# Patient Record
Sex: Male | Born: 1985 | Race: White | Hispanic: No | Marital: Single | Smoking: Former smoker
Health system: Southern US, Community
[De-identification: ages and names within clinical notes are randomized; demographics above are authoritative.]

## PROBLEM LIST (undated history)

## (undated) DIAGNOSIS — I10 Essential (primary) hypertension: Secondary | ICD-10-CM

## (undated) DIAGNOSIS — T7840XA Allergy, unspecified, initial encounter: Secondary | ICD-10-CM

## (undated) HISTORY — PX: OTHER SURGICAL HISTORY: SHX169

## (undated) HISTORY — DX: Allergy, unspecified, initial encounter: T78.40XA

---
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
## 2006-03-11 ENCOUNTER — Ambulatory Visit: Payer: Self-pay | Admitting: Sports Medicine

## 2006-07-08 ENCOUNTER — Ambulatory Visit: Payer: Self-pay | Admitting: Family Medicine

## 2006-08-12 ENCOUNTER — Ambulatory Visit: Payer: Self-pay | Admitting: Family Medicine

## 2006-08-26 ENCOUNTER — Ambulatory Visit: Payer: Self-pay | Admitting: Family Medicine

## 2006-09-10 ENCOUNTER — Ambulatory Visit: Payer: Self-pay | Admitting: Family Medicine

## 2006-10-07 ENCOUNTER — Ambulatory Visit: Payer: Self-pay | Admitting: Family Medicine

## 2006-11-27 DIAGNOSIS — F1010 Alcohol abuse, uncomplicated: Secondary | ICD-10-CM

## 2006-11-27 DIAGNOSIS — F909 Attention-deficit hyperactivity disorder, unspecified type: Secondary | ICD-10-CM | POA: Insufficient documentation

## 2006-11-27 DIAGNOSIS — F17200 Nicotine dependence, unspecified, uncomplicated: Secondary | ICD-10-CM | POA: Insufficient documentation

## 2006-11-27 DIAGNOSIS — I10 Essential (primary) hypertension: Secondary | ICD-10-CM | POA: Insufficient documentation

## 2006-11-27 DIAGNOSIS — E669 Obesity, unspecified: Secondary | ICD-10-CM | POA: Insufficient documentation

## 2006-12-02 ENCOUNTER — Encounter: Payer: Self-pay | Admitting: Family Medicine

## 2006-12-02 ENCOUNTER — Ambulatory Visit: Payer: Self-pay | Admitting: Family Medicine

## 2006-12-16 ENCOUNTER — Telehealth: Payer: Self-pay | Admitting: *Deleted

## 2006-12-18 ENCOUNTER — Encounter: Admission: RE | Admit: 2006-12-18 | Discharge: 2006-12-18 | Payer: Self-pay | Admitting: Sports Medicine

## 2006-12-18 ENCOUNTER — Encounter: Payer: Self-pay | Admitting: Family Medicine

## 2006-12-18 ENCOUNTER — Ambulatory Visit: Payer: Self-pay | Admitting: Family Medicine

## 2006-12-18 DIAGNOSIS — M25579 Pain in unspecified ankle and joints of unspecified foot: Secondary | ICD-10-CM

## 2006-12-19 ENCOUNTER — Telehealth: Payer: Self-pay | Admitting: Family Medicine

## 2007-07-03 ENCOUNTER — Telehealth (INDEPENDENT_AMBULATORY_CARE_PROVIDER_SITE_OTHER): Payer: Self-pay | Admitting: *Deleted

## 2007-07-03 ENCOUNTER — Ambulatory Visit: Payer: Self-pay | Admitting: Family Medicine

## 2007-07-03 ENCOUNTER — Encounter: Admission: RE | Admit: 2007-07-03 | Discharge: 2007-07-03 | Payer: Self-pay | Admitting: Sports Medicine

## 2007-07-10 ENCOUNTER — Ambulatory Visit: Payer: Self-pay | Admitting: Family Medicine

## 2007-07-10 ENCOUNTER — Encounter (INDEPENDENT_AMBULATORY_CARE_PROVIDER_SITE_OTHER): Payer: Self-pay | Admitting: *Deleted

## 2007-07-17 ENCOUNTER — Ambulatory Visit: Payer: Self-pay | Admitting: Internal Medicine

## 2007-07-17 ENCOUNTER — Telehealth: Payer: Self-pay | Admitting: *Deleted

## 2008-01-15 ENCOUNTER — Telehealth: Payer: Self-pay | Admitting: Family Medicine

## 2008-02-09 ENCOUNTER — Emergency Department (HOSPITAL_COMMUNITY): Admission: EM | Admit: 2008-02-09 | Discharge: 2008-02-10 | Payer: Self-pay | Admitting: Emergency Medicine

## 2008-02-12 ENCOUNTER — Ambulatory Visit (HOSPITAL_COMMUNITY): Admission: RE | Admit: 2008-02-12 | Discharge: 2008-02-12 | Payer: Self-pay | Admitting: Otolaryngology

## 2009-02-25 ENCOUNTER — Emergency Department (HOSPITAL_COMMUNITY): Admission: EM | Admit: 2009-02-25 | Discharge: 2009-02-25 | Payer: Self-pay | Admitting: Emergency Medicine

## 2009-03-01 ENCOUNTER — Encounter: Admission: RE | Admit: 2009-03-01 | Discharge: 2009-03-01 | Payer: Self-pay | Admitting: Family Medicine

## 2009-03-01 ENCOUNTER — Ambulatory Visit: Payer: Self-pay | Admitting: Family Medicine

## 2009-03-01 ENCOUNTER — Encounter: Payer: Self-pay | Admitting: *Deleted

## 2009-03-01 DIAGNOSIS — M25519 Pain in unspecified shoulder: Secondary | ICD-10-CM | POA: Insufficient documentation

## 2009-03-02 ENCOUNTER — Telehealth: Payer: Self-pay | Admitting: *Deleted

## 2009-03-02 ENCOUNTER — Telehealth (INDEPENDENT_AMBULATORY_CARE_PROVIDER_SITE_OTHER): Payer: Self-pay | Admitting: *Deleted

## 2009-03-03 ENCOUNTER — Encounter: Payer: Self-pay | Admitting: *Deleted

## 2009-08-25 ENCOUNTER — Emergency Department (HOSPITAL_COMMUNITY): Admission: EM | Admit: 2009-08-25 | Discharge: 2009-08-25 | Payer: Self-pay | Admitting: Family Medicine

## 2009-09-11 ENCOUNTER — Ambulatory Visit: Payer: Self-pay | Admitting: Family Medicine

## 2009-09-11 DIAGNOSIS — R05 Cough: Secondary | ICD-10-CM

## 2010-01-17 ENCOUNTER — Telehealth: Payer: Self-pay | Admitting: Family Medicine

## 2010-01-18 ENCOUNTER — Ambulatory Visit: Payer: Self-pay | Admitting: Family Medicine

## 2010-01-26 DIAGNOSIS — S90569A Insect bite (nonvenomous), unspecified ankle, initial encounter: Secondary | ICD-10-CM | POA: Insufficient documentation

## 2010-01-26 DIAGNOSIS — W57XXXA Bitten or stung by nonvenomous insect and other nonvenomous arthropods, initial encounter: Secondary | ICD-10-CM | POA: Insufficient documentation
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
## 2010-07-17 ENCOUNTER — Ambulatory Visit: Payer: Self-pay | Admitting: Family Medicine

## 2010-07-17 DIAGNOSIS — H6120 Impacted cerumen, unspecified ear: Secondary | ICD-10-CM

## 2010-10-27 ENCOUNTER — Emergency Department (HOSPITAL_COMMUNITY)
Admission: EM | Admit: 2010-10-27 | Discharge: 2010-10-27 | Payer: Self-pay | Source: Home / Self Care | Admitting: Emergency Medicine

## 2010-10-30 NOTE — Assessment & Plan Note (Signed)
Summary: PHYSICAL/[facility]   Vital Signs:  Patient profile:   25 year old male Height:      69.75 inches Weight:      248.7 pounds BMI:     36.07 Temp:     98.3 degrees F oral Pulse rate:   89 / minute BP sitting:   131 / 86  (left arm) Cuff size:   large  Vitals Entered By: Garen Grams LPN (July 17, 2010 9:38 AM) CC: cpe Is Patient Diabetic? No Pain Assessment Patient in pain? no        Primary Care Tavon Corriher:  Bobby Rumpf  MD  CC:  cpe.  History of Present Illness: 1) Obesity: Wants to lose weight. 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 Weight was 242 lbs in April 2011, 239 lbsv last July 2010 - now 248 lbs. Eats 4-5 servings fruits and vegetables daily, eats lean meats. Drinks sodas, gatorade, 3-4 beers per week. No fast foods or fried foods. Motivated to join gym and start exercise routine. BMI = 36  ROS: Denies chest pain, dyspnea on exertion, hypothyroid symptoms, depression, issues with bladder / bowel, sexual dysfunction   2) "Male enhancement" questions: Questions about male enhancement products. No sexual issues - was just wondering if safe to use.   3) Former tobacco use: quit smoking at age 82 (started at age 28). Has not smoked since.   4) HTN: No medications currently. Diet and (lack of) exercise as above. Denies chest pain, dyspnea, LE edema, focal neurological signs. Does not monitor salt intake.  Habits & Providers  Alcohol-Tobacco-Diet     Tobacco Status: quit     Tobacco Counseling: not to resume use of tobacco products     Passive Smoke Exposure: yes  Current Medications (verified): 1)  None  Allergies (verified): 1)  ! Penicillin  Past History:  Past Medical History: Last updated: 11/27/2006 oppositional defiant d/o  Family History: Last updated: 12/02/2006 brother with Autism HTN maternal GM, mother  Social History: Last updated:  12/02/2006 Lives with roomate, moved out of mom's house in 6/07.; graduated from [employer]- looking into [employer]; Enjoys art.  Has trouble with math.  Now working at [employer]  Social History: Smoking Status:  quit  Physical Exam  General:  VS reviewed.  Well appearing, NAD, obese, pleasant Head:  normocephalic and atraumatic.   Eyes:  vision grossly intact, pupils equal, pupils round, pupils reactive to light, corneas and lenses clear, and no retinal abnormalitiies.   Ears:  R ear normal and L cerumen impaction (flushed by nurse) - then TM clear   Nose:  no external deformity.   Mouth:  good dentition, no gingival abnormalities, no dental plaque, and pharynx pink and moist.   Neck:  supple, full ROM, and no masses.   Chest Wall:  no deformities and no tenderness.   Breasts:  no gynecomastia and no masses.   Lungs:  normal respiratory effort, no intercostal retractions, no accessory muscle use, normal breath sounds, no crackles, and no wheezes.   Heart:  normal rate, regular rhythm, and no murmur.   Abdomen:  soft, non-tender, normal bowel sounds, no distention, no masses, no guarding, no rigidity, no rebound tenderness, and no abdominal hernia.   Msk:  normal ROM, no joint tenderness, no joint swelling, no joint warmth, no redness over joints, no joint deformities, and no joint instability.  Pulses:  R radial normal and L radial normal.   Extremities:  No clubbing, cyanosis, edema, or deformity noted with normal full range of motion of all joints.   Neurologic:  alert & oriented X3, cranial nerves II-XII intact, strength normal in all extremities, sensation intact to light touch, sensation intact to pinprick, gait normal, and DTRs symmetrical and normal.   Skin:  few tattoos o/w normal  Psych:  Oriented X3, memory intact for recent and remote, normally interactive, good eye contact, not anxious appearing, and not depressed appearing.  Pleasant.    Impression & Recommendations:  Problem # 1:   IMPACTED CERUMEN (ICD-380.4) Assessment New Left ear. Flushed by nurse - normal exam following flush.  Orders: Cerumen Impaction Removal-[facility] (16109)  Problem # 2:  TOBACCO DEPENDENCE (ICD-305.1) Assessment: Improved Quit smoking several years ago. Congratulated on efforts to remain quit.  Problem # 3:  OBESITY, NOS (ICD-278.00) Advised patient to eliminate sodas, beer, sugary drinks. Patient highly motivated today. Will continue to make healthy food choices; will hopefully commit himself to an exercise program.  Will follow in 6 months.  Orders: [facility]- Est Level  3 (60454)  Problem # 4:  Preventive Health Care (ICD-V70.0) Flu shot deferred today.   Problem # 5:  HYPERTENSION, BENIGN SYSTEMIC (ICD-401.1) At goal today off medications. Advised to stay quit on tobacco use, increase exercise, follow DASH diet, lose weight. Will follow in 6 months. Would check CMET, lipids at that time.   Other Orders: [facility] - Est  18-39 yrs [id number])  Patient Instructions: 1)  It was great to meet you today!  2)  Eliminate sodas and beer from your diet - replace with water. 3)  Get 30-45 minutes of exercise at least 5 times per week - try to rejoin the gym  4)  Follow up with me in 6 months    Orders Added: 1)  Cerumen Impaction Removal-[facility] [69210] 2)  [facility] - Est  18-39 yrs [91478]

## 2010-10-30 NOTE — Progress Notes (Signed)
Summary: triage - Tick bite    Phone Note Call from Patient Call back at Home Phone [phone]   Caller: Patient Summary of Call: Found a tic and read around area & not sure if something still might still be in area.  Should he be seen? Initial call taken by: Clydell Hakim,  January 17, 2010 4:23 PM  Follow-up for Phone Call        found tick today. skin is red from where he tore it off. thinks part of  the bug may be in him. wants it checked. appt at 8:15 tomorrow. told him other signs to watch out for for if tick transmitted a disease Follow-up by: Golden Circle RN,  January 17, 2010 4:49 PM  Additional Follow-up for Phone Call Additional follow up Details #1::        Reviewed Dr. Leonie Green clinic visit. Patient prophylaxed with Doxcycline 200 mg  Additional Follow-up by: Bobby Rumpf  MD,  January 23, 2010 9:32 AM

## 2010-10-30 NOTE — Assessment & Plan Note (Signed)
Summary: tick bite/[place]/carew   Vital Signs:  Patient profile:   25 year old male Height:      69.75 inches Weight:      242.2 pounds BMI:     35.13 Temp:     98.3 degrees F oral Pulse rate:   77 / minute BP sitting:   143 / 84  (left arm) Cuff size:   regular  Vitals Entered By: Garen Grams LPN (January 18, 2010 9:06 AM) CC: tick bite on inner thigh Is Patient Diabetic? No Pain Assessment Patient in pain? no        Primary Care Provider:  Bobby Rumpf  MD  CC:  tick bite on inner thigh.  History of Present Illness: 25 yo here for  work-in for tick bite  yesterday saw a tick in his groin area and pulled it out.  Was usnure if he got all of it.  Fels confident it was a tick. Did not bring in today.  Mild redness around the area but otherwise asymptomatic.  Denies fever, rash, joint pain, arthalgias, n/v, or other systemic symptoms.  No outdoor activities.  Habits & Providers  Alcohol-Tobacco-Diet     Tobacco Status: current     Tobacco Counseling: to quit use of tobacco products  Allergies: 1)  ! Penicillin PMH-FH-SH reviewed-no changes except otherwise noted  Social History: Smoking Status:  current  Review of Systems      See HPI  Physical Exam  General:  VS reviewed.  Well appearing, NAD Skin:  right groin:0.5 cm area of erythema around an insect bite.  Scab present, removed with sterile pickups after area cleaned to make sure no tick parts still present.     Impression & Recommendations:  Problem # 1:  TICK BITE (ICD-E906.4)  Will prophylax with Doxycyclin 200 mg which will prevent lyme disease and is also antibiotic of choice for RMSF.  Asymptomatic.  Given red flags for return.  if develops signs of RMSF, will need a complete course of docycycline.  Orders: [facility]- Est Level  3 (99213)  Complete Medication List: 1)  Refenesen Dm 400-20 Mg Tabs (Dextromethorphan-guaifenesin) .Marland Kitchen.. 1 tab by mouth every 4hours as needed for cough 2)  Doxycycline Hyclate 100  Mg Caps (Doxycycline hyclate) .... Take 2 tablets  Patient Instructions: 1)  Sent you prescription for doxycycline 2)  Return if you notice expanding redness at area, fevers, chills, joint pain, muscle pain, nausea/vomiting Prescriptions: DOXYCYCLINE HYCLATE 100 MG CAPS (DOXYCYCLINE HYCLATE) take 2 tablets  #2 x 0   Entered and Authorized by:   Delbert Harness MD   Signed by:   Delbert Harness MD on 01/18/2010   Method used:   Electronically to        [facility] (retail)       [address]       [place], [place]  [id number]       Ph: [phone]       Fax: [phone]   RxID:   [id number]   Appended Document: tick bite/[place]/carew     Allergies: 1)  ! Penicillin   Complete Medication List: 1)  Refenesen Dm 400-20 Mg Tabs (Dextromethorphan-guaifenesin) .Marland Kitchen.. 1 tab by mouth every 4hours as needed for cough 2)  Doxycycline Hyclate 100 Mg Caps (Doxycycline hyclate) .... Take 2 tablets  Other Orders: [facility]- Est Level  3 (06237)

## 2011-01-08 LAB — CBC
HCT: 43.8 % (ref 39.0–52.0)
Hemoglobin: 15.4 g/dL (ref 13.0–17.0)
MCV: 87.3 fL (ref 78.0–100.0)
RBC: 5.01 MIL/uL (ref 4.22–5.81)
WBC: 9.9 10*3/uL (ref 4.0–10.5)

## 2011-01-08 LAB — BASIC METABOLIC PANEL
BUN: 11 mg/dL (ref 6–23)
Chloride: 104 mEq/L (ref 96–112)
GFR calc Af Amer: >60 mL/min (ref >60)
Potassium: 3.4 mEq/L — ABNORMAL LOW (ref 3.5–5.1)
Sodium: 141 mEq/L (ref 135–145)

## 2011-01-08 LAB — ETHANOL: Alcohol, Ethyl (B): 117 mg/dL — ABNORMAL HIGH (ref 0–10)

## 2011-02-12 NOTE — Op Note (Signed)
NAME:  Tyler Krause, Tyler Krause               ACCOUNT NO.:  [id number]   MEDICAL RECORD NO.:  [id number]          PATIENT TYPE:  AMB   LOCATION:  SDS                          FACILITY:  [facility]   PHYSICIAN:  Kristine Garbe. Ezzard Standing, M.D.DATE OF BIRTH:  1985/10/08   DATE OF PROCEDURE:  02/12/2008  DATE OF DISCHARGE:  02/12/2008                               OPERATIVE REPORT   PREOPERATIVE DIAGNOSIS:  Hypopharyngeal foreign body.   POSTOPERATIVE DIAGNOSIS:  Fish bone stuck in base of the tongue.   OPERATION:  Direct laryngoscopy with removal of foreign body from base  of tongue.   SURGEON:  Kristine Garbe. Ezzard Standing, MD   ANESTHESIA:  General.   COMPLICATIONS:  None.   BRIEF CLINICAL NOTE:  Tyler Krause is a 25 year old gentleman who was  eating fish this past Monday and got what he felt was a fish bone caught  in his throat.  He was seen in [facility] on Monday  evening and had an x-ray which did not demonstrate any obvious fish  bone.  The pain discomfort persisted and he presented my office earlier  today with some pain in the base of tongue area.  On exam in the office  with indirect laryngoscopy, the patient had a tip of a fish bone could  be visualized in the base of tongue in the region of the vallecula about  the level of the tip of the epiglottis on the left side of base of  tongue.  I was unable to retrieve this in the office under topical  anesthetic.  The patient was taken to operating room at this time for  direct laryngoscopy and excision of fish bone from base of tongue under  general anesthetic.   DESCRIPTION OF PROCEDURE:  After adequate IV sedation, direct  laryngoscopy was performed with the kaleidoscope.  The tip of the fish  bone was identified and the left base of tongue and was removed with a  right angle forceps without difficulty.  This completed procedure.  Tyler Krause was awoke from anesthesia and transferred to recovery room postop  and doing well.   DISPOSITION:  Tyler Krause is discharged home later this evening.  He will  follow up my office, if he has any further difficulties.           ______________________________  Kristine Garbe Ezzard Standing, M.D.     CEN/MEDQ  D:  02/12/2008  T:  02/13/2008  Job:  [id number]

## 2011-02-15 NOTE — Op Note (Signed)
[place]  Patient:    Tyler Krause, Tyler A. Visit Number: [id number] MRN: [id number]          Service Type: Attending:  Verl Dicker, M.D. Dictated by:   Verl Dicker, M.D. Proc. Date: 06/23/01                             Operative Report  REFERRING PHYSICIAN:  _____.  EMERGENCY ROOM PHYSICIAN:  Earline Mayotte, M.D.  PREOPERATIVE DIAGNOSIS:  Torsion, left testis.  POSTOPERATIVE DIAGNOSIS:  Torsion, left testis.  PROCEDURE:  Right and left orchiopexy.  ANESTHESIA:  General.  DRAINS:  None.  COMPLICATIONS:  None.  DESCRIPTION OF PROCEDURE:  The patient was prepped and draped in the supine position after institution of an adequate level of general anesthesia. Transverse incision was made over the left external inguinal ring through the skin and subcutaneous tissue.  Spermatic cord was encircled as it exited the external inguinal ring.  Gentle upward traction on the cord produced the testis through the incision.  Gubernaculum was then divided.  Testis appeared to have good blood supply secondary to manual de-torsion in the ER by Dr. Tamera Punt.  There was a small reactive hydrocele.  Hydrocele was entered and everted.  Edges of the hydrocele sac were then sewn back on themselves with interrupted sutures of 3-0 chromic.  Testis was then affixed to a dependent portion of the left hemiscrotum with interrupted sutures of 5-0 nylon.  Care was taken to place the stitches within dependent portion of the dartos and to not pierce the skin.  The testis was then gently placed within the left hemiscrotum.  There was no kinking or torsion on the cord.  Nylon sutures had been placed a the upper pole, midpole, and lower pole.  Skin of the transverse incision was closed with 4-0 Dexon in a subcuticular fashion.  On the right side, a vertical incision was made in the anterior surface of the right hemiscrotum through the skin and dartos, carried down  to the tunic, which was also divided vertically to expose the anterior surface of the testis. Interrupted sutures of 5-0 nylon were then used to pex the tunic to the anterior surface of the testis at the upper pole, midpole, and lower pole. Tunic was then closed with interrupted sutures of 3-0 Dexon.  Dartos was then closed with interrupted sutures of 3-0 Dexon.  Skin was closed with interrupted sutures of 4-0 chromic.  Wound was covered with fluffs, ice bag, and athletic supporter, and the patient was returned to recovery in satisfactory condition. Dictated by:   Verl Dicker, M.D. Attending:  Verl Dicker, M.D. DD:  06/23/01 TD:  06/23/01 Job: [id number]

## 2011-03-24 ENCOUNTER — Emergency Department (HOSPITAL_COMMUNITY): Payer: Self-pay

## 2011-03-24 ENCOUNTER — Emergency Department (HOSPITAL_COMMUNITY)
Admission: EM | Admit: 2011-03-24 | Discharge: 2011-03-24 | Disposition: A | Discharge status: Home / Self Care | Payer: Self-pay | Attending: Emergency Medicine | Admitting: Emergency Medicine

## 2011-03-24 DIAGNOSIS — R0602 Shortness of breath: Secondary | ICD-10-CM | POA: Insufficient documentation

## 2011-03-24 DIAGNOSIS — F988 Other specified behavioral and emotional disorders with onset usually occurring in childhood and adolescence: Secondary | ICD-10-CM | POA: Insufficient documentation

## 2011-03-24 LAB — POCT I-STAT, CHEM 8
BUN: 12 mg/dL (ref 6–23)
Calcium, Ion: 1.1 mmol/L — ABNORMAL LOW (ref 1.12–1.32)
Creatinine, Ser: 1.2 mg/dL (ref 0.50–1.35)
Glucose, Bld: 91 mg/dL (ref 70–99)
TCO2: 24 mmol/L (ref 0–100)

## 2011-03-27 ENCOUNTER — Encounter: Payer: Self-pay | Admitting: Family Medicine

## 2011-03-27 ENCOUNTER — Ambulatory Visit (INDEPENDENT_AMBULATORY_CARE_PROVIDER_SITE_OTHER): Payer: Self-pay | Admitting: Family Medicine

## 2011-03-27 VITALS — BP 131/81 | HR 94 | Temp 98.0°F | Wt 227.0 lb

## 2011-03-27 DIAGNOSIS — R0602 Shortness of breath: Secondary | ICD-10-CM

## 2011-03-27 NOTE — Patient Instructions (Signed)
Follow up in 6 weeks if this continues to be a problem. You should try to shoot for a goal weight of 200 lbs  If you start having chest pain or worse shortness of breath come back in. Find healthy ways to deal with stress like we talked about.  - Dr Wallene Huh

## 2011-03-28 DIAGNOSIS — R0602 Shortness of breath: Secondary | ICD-10-CM | POA: Insufficient documentation

## 2011-03-28 NOTE — Assessment & Plan Note (Addendum)
History, exam and previous workup make organic cause unlikely - appears to be related to stress at home. Patient states that this stress is currently being resolved. Advised regarding healthy means of stress relief - particularly with emphasis on exercise. No red flags associated with his symptoms. Follow up at next visit. If continues to have symptoms might consider further workup (may consider PFT, stress echocardiogram at that time).

## 2011-03-28 NOTE — Progress Notes (Signed)
  Subjective:    Patient ID: Tyler Krause, male    DOB: 12-04-1985, 25 y.o.   MRN: [id number]  HPI  1) Follow up ER visit for "shortness of breath": Seen at ER on 6/24 for "shortness of breath". Describes as "feeling of needing to take deep breath". Occurs intermittently - maybe every few days since ER visit (though not as severely as on day of ER visit). Symptoms started on day of ER visit. Endorses increased stress at home prior to this starting related to "girlfriend troubles". Denies chest pain, chest tightness, trauma, wheezing, cough, fever, chills, GERD symptoms, other specific trigger, lower extremity DVT symptoms, depressive symptoms, manic symptoms, emesis, abdominal pain or swelling, LE edema, snoring, daytime somnolence, apneic episodes with sleep. Has been exercising more frequently in effort to lose weight as per our previous discussion - he does not experience these symptoms with moderate to intense exercise. Quit smoking as per last visit. Trying to reduce alcohol consumption - last had alcohol 1 month ago (10 drinks at one time). Denies drug use.   D-dimer, chest Xray, CBC and basic metabolic panel and EKG were essentially normal at time of evaluation.   Pertinent past history reviewed   Review of Systems As per HPI     Objective:   Physical Exam  Constitutional: He is oriented to person, place, and time. He appears well-developed and well-nourished. No distress.  Eyes: EOM are normal. Pupils are equal, round, and reactive to light.  Neck: Normal range of motion. No JVD present. No thyromegaly present.  Cardiovascular: Normal rate, regular rhythm, normal heart sounds and intact distal pulses.  Exam reveals no friction rub.   No murmur heard. Pulmonary/Chest: Effort normal and breath sounds normal. No stridor. No respiratory distress. He has no wheezes. He has no rales. He exhibits no tenderness.  Abdominal: Soft. Bowel sounds are normal. He exhibits no distension. There is no  tenderness.  Lymphadenopathy:    He has no cervical adenopathy.  Neurological: He is alert and oriented to person, place, and time.  Skin: Skin is warm and dry.  Psychiatric: He has a normal mood and affect. His behavior is normal. Judgment and thought content normal.          Assessment & Plan:

## 2011-06-26 LAB — CBC
HCT: 46
Hemoglobin: 15.9
MCV: 87.3
Platelets: 260
RDW: 12

## 2011-10-03 ENCOUNTER — Ambulatory Visit (INDEPENDENT_AMBULATORY_CARE_PROVIDER_SITE_OTHER): Payer: PRIVATE HEALTH INSURANCE | Admitting: Family Medicine

## 2011-10-03 ENCOUNTER — Encounter: Payer: Self-pay | Admitting: Family Medicine

## 2011-10-03 VITALS — BP 150/73 | HR 89 | Temp 97.9°F | Ht 69.75 in | Wt 208.0 lb

## 2011-10-03 DIAGNOSIS — Z23 Encounter for immunization: Secondary | ICD-10-CM

## 2011-10-03 NOTE — Progress Notes (Signed)
  Subjective:    Patient ID: Tyler Krause, male    DOB: Sep 18, 1986, 26 y.o.   MRN: [id number]  HPI Here for well visit today. Has made significant changes to his diet. He cut out soda he is drinking less beer and he is not eating fried foods. He is doing calisthenics for exercise as well as some occasional running. He drinks 2 gallons of water per day. He does not take any supplements although he is considering taking some weight protein. He drinks tea  Patient has a steady girlfriend and uses condoms. He's not concerned for STDs at this time.  Skin tags-patient has several skin tags on his back that are bothersome to him when he shaves and when he exercises. These occasionally get caught on his shirt.   Review of Systems    denies fevers, chills, diarrhea, shortness of breath, chest pain Objective:   Physical Exam  Vital signs reviewed General appearance - alert, well appearing, and in no distress and oriented to person, place, and time Heart - normal rate, regular rhythm, normal S1, S2, no murmurs, rubs, clicks or gallops Chest - clear to auscultation, no wheezes, rales or rhonchi, symmetric air entry, no tachypnea, retractions or cyanosis Abdomen - soft, nontender, nondistended, no masses or organomegaly Extremities - peripheral pulses normal, no pedal edema, no clubbing or cyanosis Skin-4 pink skin tags present on back, no scabbing. No suspicious moles on back.      Assessment & Plan:

## 2011-10-03 NOTE — Patient Instructions (Signed)
Please try to limit your water consumption 6-8 glasses per day Keep in mind moderation, eating fish twice a week eating small portions of red meat.  Call your health insurance to see what it will cost to get skin tags removed. The charge here is $206.

## 2012-03-27 ENCOUNTER — Ambulatory Visit (INDEPENDENT_AMBULATORY_CARE_PROVIDER_SITE_OTHER): Payer: PRIVATE HEALTH INSURANCE | Admitting: Family Medicine

## 2012-03-27 ENCOUNTER — Encounter: Payer: Self-pay | Admitting: Family Medicine

## 2012-03-27 VITALS — BP 137/77 | HR 98 | Temp 99.0°F | Ht 69.75 in | Wt 187.2 lb

## 2012-03-27 DIAGNOSIS — J309 Allergic rhinitis, unspecified: Secondary | ICD-10-CM | POA: Insufficient documentation

## 2012-03-27 DIAGNOSIS — J029 Acute pharyngitis, unspecified: Secondary | ICD-10-CM

## 2012-03-27 MED ORDER — CETIRIZINE HCL 10 MG PO TABS: 10.0000 mg | ORAL_TABLET | Freq: Every day | ORAL | Status: DC

## 2012-03-27 NOTE — Assessment & Plan Note (Signed)
Sxs consistent with allergic rhinitis.  Will treat with zyrtec.  Discussed supportive care and infectious red flags.  Handout given.  Follow up as needed.

## 2012-03-27 NOTE — Patient Instructions (Signed)
Allergic Rhinitis  Allergic rhinitis is when the mucous membranes in the nose respond to allergens. Allergens are particles in the air that cause your body to have an allergic reaction. This causes you to release allergic antibodies. Through a chain of events, these eventually cause you to release histamine into the blood stream (hence the use of antihistamines). Although meant to be protective to the body, it is this release that causes your discomfort, such as frequent sneezing, congestion and an itchy runny nose.    CAUSES    The pollen allergens may come from grasses, trees, and weeds. This is seasonal allergic rhinitis, or "hay fever." Other allergens cause year-round allergic rhinitis (perennial allergic rhinitis) such as house dust mite allergen, pet dander and mold spores.    SYMPTOMS     Nasal stuffiness (congestion).   Runny, itchy nose with sneezing and tearing of the eyes.   There is often an itching of the mouth, eyes and ears.  It cannot be cured, but it can be controlled with medications.  DIAGNOSIS    If you are unable to determine the offending allergen, skin or blood testing may find it.  TREATMENT     Avoid the allergen.   Medications and allergy shots (immunotherapy) can help.   Hay fever may often be treated with antihistamines in pill or nasal spray forms. Antihistamines block the effects of histamine. There are over-the-counter medicines that may help with nasal congestion and swelling around the eyes. Check with your caregiver before taking or giving this medicine.  If the treatment above does not work, there are many new medications your caregiver can prescribe. Stronger medications may be used if initial measures are ineffective. Desensitizing injections can be used if medications and avoidance fails. Desensitization is when a patient is given ongoing shots until the body becomes less sensitive to the allergen. Make sure you follow up with your caregiver if problems continue.  SEEK  MEDICAL CARE IF:     You develop fever (more than 100.5 F (38.1 C).   You develop a cough that does not stop easily (persistent).   You have shortness of breath.   You start wheezing.   Symptoms interfere with normal daily activities.  Document Released: 06/11/2001 Document Revised: 09/05/2011 Document Reviewed: 12/21/2008  Exit[employer] Patient Information 2012 [employer].

## 2012-03-27 NOTE — Progress Notes (Signed)
  Subjective:    Patient ID: Tyler Krause, male    DOB: 28-Dec-1985, 26 y.o.   MRN: [id number]  HPI SORE THROAT  Onset: 2 days  Description: sore throat, dysphagia, L ear fullness Modifying factors: none  Symptoms  Fever:  no URI symptoms: no Cough: no Headache: no Rash:  no Swollen glands:   no Recent Strep Exposure: no LUQ pain: no Heartburn/brash: no Allergy Symptoms: yes; itchy eyes, scratchy throat,   Red Flags STD exposure: no Breathing difficulty: no Drooling: no Trismus: no     Review of Systems See HPI, otherwise ROS negative     Objective:   Physical Exam Gen: up in chair, NAD HEENT: NCAT, EOMI, TMs clear bilaterally, +nasal erythema, rhinorrhea bilaterally, + post oropharyngeal erythema  CV: RRR, no murmurs auscultated PULM: CTAB, no wheezes, rales, rhoncii ABD: S/NT/+ bowel sounds  EXT: 2+ peripheral pulses    Assessment & Plan:

## 2012-08-28 ENCOUNTER — Encounter (HOSPITAL_COMMUNITY): Payer: Self-pay | Admitting: Emergency Medicine

## 2012-08-28 ENCOUNTER — Emergency Department (HOSPITAL_COMMUNITY)
Admission: EM | Admit: 2012-08-28 | Discharge: 2012-08-28 | Disposition: A | Discharge status: Home / Self Care | Payer: Self-pay | Attending: Emergency Medicine | Admitting: Emergency Medicine

## 2012-08-28 ENCOUNTER — Emergency Department (HOSPITAL_COMMUNITY): Payer: Self-pay

## 2012-08-28 DIAGNOSIS — R300 Dysuria: Secondary | ICD-10-CM | POA: Insufficient documentation

## 2012-08-28 DIAGNOSIS — N453 Epididymo-orchitis: Secondary | ICD-10-CM | POA: Insufficient documentation

## 2012-08-28 DIAGNOSIS — I861 Scrotal varices: Secondary | ICD-10-CM | POA: Insufficient documentation

## 2012-08-28 DIAGNOSIS — N509 Disorder of male genital organs, unspecified: Secondary | ICD-10-CM | POA: Insufficient documentation

## 2012-08-28 DIAGNOSIS — Z87891 Personal history of nicotine dependence: Secondary | ICD-10-CM | POA: Insufficient documentation

## 2012-08-28 DIAGNOSIS — N451 Epididymitis: Secondary | ICD-10-CM

## 2012-08-28 DIAGNOSIS — N5089 Other specified disorders of the male genital organs: Secondary | ICD-10-CM | POA: Insufficient documentation

## 2012-08-28 LAB — URINALYSIS, ROUTINE W REFLEX MICROSCOPIC
Glucose, UA: NEGATIVE mg/dL
Ketones, ur: NEGATIVE mg/dL
Specific Gravity, Urine: 1.034 — ABNORMAL HIGH (ref 1.005–1.030)
pH: 6.5 (ref 5.0–8.0)

## 2012-08-28 LAB — URINE MICROSCOPIC-ADD ON

## 2012-08-28 MED ORDER — LIDOCAINE HCL (PF) 1 % IJ SOLN
INTRAMUSCULAR | Status: AC
  Administered 2012-08-28: 2 mL
  Filled 2012-08-28: qty 5

## 2012-08-28 MED ORDER — IBUPROFEN 600 MG PO TABS: 600.0000 mg | ORAL_TABLET | Freq: Four times a day (QID) | ORAL | Status: DC | PRN

## 2012-08-28 MED ORDER — OXYCODONE-ACETAMINOPHEN 5-325 MG PO TABS
2.0000 | ORAL_TABLET | Freq: Once | ORAL | Status: AC
  Administered 2012-08-28: 2 via ORAL
  Filled 2012-08-28: qty 2

## 2012-08-28 MED ORDER — DOXYCYCLINE HYCLATE 100 MG PO CAPS: 100.0000 mg | ORAL_CAPSULE | Freq: Two times a day (BID) | ORAL | Status: DC

## 2012-08-28 MED ORDER — HYDROCODONE-ACETAMINOPHEN 5-325 MG PO TABS: 1.0000 | ORAL_TABLET | Freq: Four times a day (QID) | ORAL | Status: DC | PRN

## 2012-08-28 MED ORDER — CEFTRIAXONE SODIUM 1 G IJ SOLR
1.0000 g | Freq: Once | INTRAMUSCULAR | Status: AC
  Administered 2012-08-28: 1 g via INTRAMUSCULAR
  Filled 2012-08-28: qty 10

## 2012-08-28 MED ORDER — AZITHROMYCIN 250 MG PO TABS
1000.0000 mg | ORAL_TABLET | Freq: Once | ORAL | Status: AC
  Administered 2012-08-28: 1000 mg via ORAL
  Filled 2012-08-28: qty 4

## 2012-08-28 NOTE — ED Provider Notes (Signed)
History     CSN: [id number]  Arrival date & time 08/28/12  1403   First MD Initiated Contact with Patient 08/28/12 1504      Chief Complaint  Patient presents with  . Flank Pain  . Testicle Pain    (Consider location/radiation/quality/duration/timing/severity/associated sxs/prior treatment) HPI Comments: 27 year old male with a history of testicular torsion repair (does not recall which side)  presents to the emergency department complaining of right-sided testicular pain.  Onset of symptoms began approximately 5-7 days ago and were associated with enlarged testicle, severe pain, dysuria, and right-sided flank pain.  Patient states that pain radiates from right flank to groin and back as well as across the suprapubic region when his testicles are touched.  Patient denies urinary frequency, penile discharge, new lesions, history of hernia, fever, night sweats, chills, trauma.  Patient is currently sexually active and has had unprotected sex in the last 3 weeks.  The history is provided by the patient.    History reviewed. No pertinent past medical history.  History reviewed. No pertinent past surgical history.  History reviewed. No pertinent family history.  History  Substance Use Topics  . Smoking status: Former Smoker    Quit date: 03/27/2009  . Smokeless tobacco: Not on file  . Alcohol Use: Yes      Review of Systems  Constitutional: Negative for fever, chills and appetite change.  HENT: Negative for congestion.   Eyes: Negative for visual disturbance.  Respiratory: Negative for shortness of breath.   Cardiovascular: Negative for chest pain and leg swelling.  Gastrointestinal: Negative for nausea, vomiting and abdominal pain.  Genitourinary: Positive for dysuria, flank pain, scrotal swelling and testicular pain. Negative for urgency, frequency, discharge and penile pain.  Neurological: Negative for dizziness, syncope, weakness, light-headedness, numbness and headaches.   Psychiatric/Behavioral: Negative for confusion.  All other systems reviewed and are negative.    Allergies  Penicillins  Home Medications  No current outpatient prescriptions on file.  BP 138/80  Pulse 95  Temp 97.7 F (36.5 C) (Oral)  Resp 18  SpO2 98%  Physical Exam  Nursing note and vitals reviewed. Constitutional: He is oriented to person, place, and time. He appears well-developed and well-nourished. No distress.  HENT:  Head: Normocephalic and atraumatic.  Eyes: Conjunctivae normal and EOM are normal.  Neck: Normal range of motion.  Pulmonary/Chest: Effort normal.  Abdominal:       No suprapubic tenderness to palpation.  Genitourinary:       Exam chaperoned.  Negative Phren sign.  Right testicle slightly elevated in comparison to left.  2 sebaceous cysts noted.  Severe tenderness to palpation of right testicle with increased firmness.  No evidence of inguinal hernia.  No CVA tenderness.  Penis circumcised and normal without discharge, swelling or tenderness  Musculoskeletal: Normal range of motion.  Neurological: He is alert and oriented to person, place, and time.  Skin: Skin is warm and dry. No rash noted. He is not diaphoretic.  Psychiatric: He has a normal mood and affect. His behavior is normal.    ED Course  Procedures (including critical care time)  Labs Reviewed  URINALYSIS, ROUTINE W REFLEX MICROSCOPIC - Abnormal; Notable for the following:    APPearance CLOUDY (*)     Specific Gravity, Urine 1.034 (*)     Bilirubin Urine SMALL (*)     Protein, ur 30 (*)     Leukocytes, UA LARGE (*)     All other components within normal limits  URINE  MICROSCOPIC-ADD ON  GC/CHLAMYDIA PROBE AMP   US Scrotum  08/28/2012  *RADIOLOGY REPORT*  Clinical Data:  Left testicular pain when walking.  SCROTAL ULTRASOUND DOPPLER ULTRASOUND OF THE TESTICLES  Technique: Complete ultrasound examination of the testicles, epididymis, and other scrotal structures was performed.   Color and spectral Doppler ultrasound were also utilized to evaluate blood flow to the testicles.  Comparison:  Scrotal ultrasound 09/18/2005.  Findings:  Right testis:  Normal in size and echotexture without focal parenchymal abnormality, measuring approximately 5.1 x 3.0 x 3.0 cm.  Normal color Doppler flow.  Left testis:  Normal in size and echotexture without focal parenchymal abnormality, measuring approximately 5.2 x 3.2 x 3.4 cm.  Normal color Doppler flow.  Right epididymis:  Enlarged head measuring approximately 2.2 x 1.2 x 1.5 cm.  Mild hyperemia on color Doppler evaluation.  Left epididymis:  Suboptimally visualized, but no evidence of enlargement or hyperemia.  Hydrocele:  Absent bilaterally.  Varicocele:  Moderate sized left varicocele, increased in size since 2006.  No right varicocele.  Pulsed Doppler interrogation of both testes demonstrates normal low resistance arterial waveforms and normal venous waveforms bilaterally  IMPRESSION:  1.  Moderately large left varicocele, increased in size since 2006. This is likely the cause of the patient's left scrotal pain. 2.  Mildly enlarged right epididymal head with hyperemia.  This is consistent with epididymitis in the proper clinical setting. 3.  Normal-appearing testes bilaterally.  No evidence of testicular torsion.   Original Report Authenticated By: Hulan Saas, M.D.    [place] Art/ven Flow Abd Pelv Doppler  08/28/2012  *RADIOLOGY REPORT*  Clinical Data:  Left testicular pain when walking.  SCROTAL ULTRASOUND DOPPLER ULTRASOUND OF THE TESTICLES  Technique: Complete ultrasound examination of the testicles, epididymis, and other scrotal structures was performed.  Color and spectral Doppler ultrasound were also utilized to evaluate blood flow to the testicles.  Comparison:  Scrotal ultrasound 09/18/2005.  Findings:  Right testis:  Normal in size and echotexture without focal parenchymal abnormality, measuring approximately 5.1 x 3.0 x 3.0 cm.  Normal  color Doppler flow.  Left testis:  Normal in size and echotexture without focal parenchymal abnormality, measuring approximately 5.2 x 3.2 x 3.4 cm.  Normal color Doppler flow.  Right epididymis:  Enlarged head measuring approximately 2.2 x 1.2 x 1.5 cm.  Mild hyperemia on color Doppler evaluation.  Left epididymis:  Suboptimally visualized, but no evidence of enlargement or hyperemia.  Hydrocele:  Absent bilaterally.  Varicocele:  Moderate sized left varicocele, increased in size since 2006.  No right varicocele.  Pulsed Doppler interrogation of both testes demonstrates normal low resistance arterial waveforms and normal venous waveforms bilaterally  IMPRESSION:  1.  Moderately large left varicocele, increased in size since 2006. This is likely the cause of the patient's left scrotal pain. 2.  Mildly enlarged right epididymal head with hyperemia.  This is consistent with epididymitis in the proper clinical setting. 3.  Normal-appearing testes bilaterally.  No evidence of testicular torsion.   Original Report Authenticated By: Hulan Saas, M.D.      No diagnosis found.    MDM  Labs and imaging reviewed. Pt treated in ER w 1g rocephin and 1 g azith. Patient will be dc w Doxy 100 BID x 10 days. Pt denies a hx of renal issues or rectal pain. Will place on Motrin 600 BID x 10 days to help pain. Patient has been advised to rest, ice and scrotal support to help he is pain. Recommended  purchasing jockstrap. Presentation non-concerning for testicular torsion or prostatitis. Patient is hemodynamically stable and in no acute distress prior to discharge. Patient is agreeable to plan and will followup with urology if symptoms persist.          Jaci Carrel, PA-C 08/28/12 1644

## 2012-08-28 NOTE — ED Notes (Signed)
Pt c/o right testicle pain and swelling x 5 days and now having right sided flank pain; pt denies hematuria

## 2012-08-29 LAB — URINE CULTURE: Colony Count: NO GROWTH

## 2012-08-29 NOTE — ED Provider Notes (Signed)
Medical screening examination/treatment/procedure(s) were performed by non-physician practitioner and as supervising physician I was immediately available for consultation/collaboration.   Carleene Cooper III, MD 08/29/12 [phone]

## 2014-01-03 IMAGING — US US SCROTUM
1 series · 13 of 25 positions shown · non-contrast
Comparison: Scrotal ultrasound 09/18/2005.

CLINICAL DATA: Left testicular pain when walking.

SCROTAL ULTRASOUND
DOPPLER ULTRASOUND OF THE TESTICLES
TECHNIQUE: Complete ultrasound examination of the testicles,
epididymis, and other scrotal structures was performed.  Color and
spectral Doppler ultrasound were also utilized to evaluate blood
flow to the testicles.

[Series 1: us scrotum · 13 of 31 slices shown]
[im 1/31]
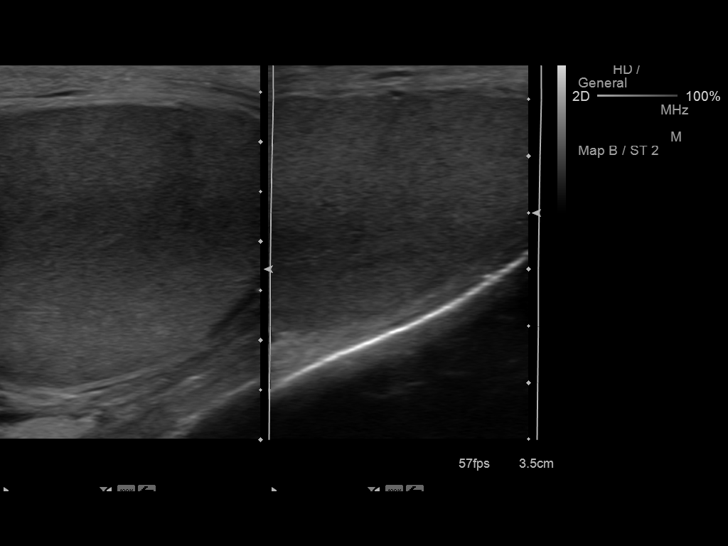
[im 3/31]
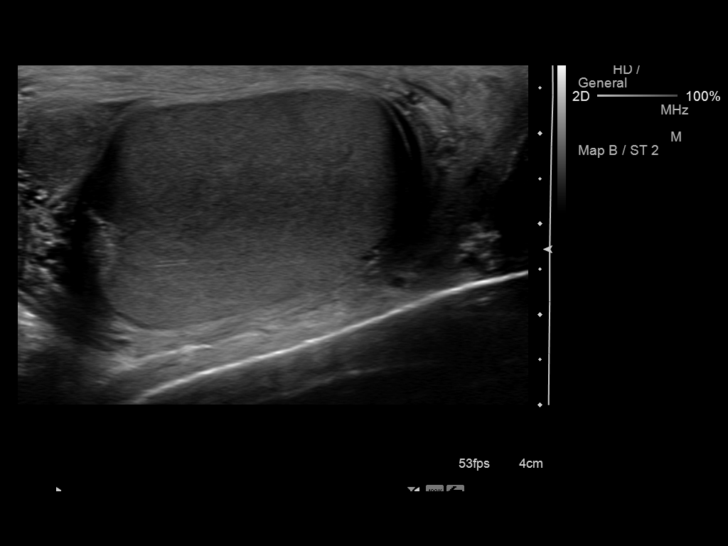
[im 6/31]
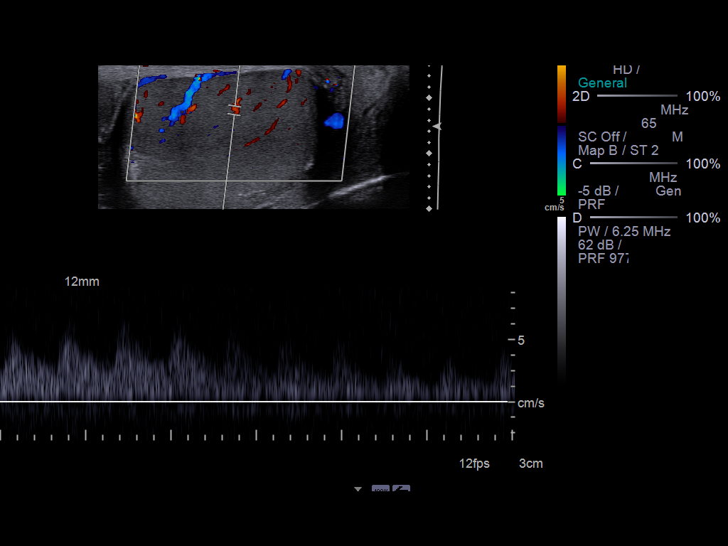
[im 8/31]
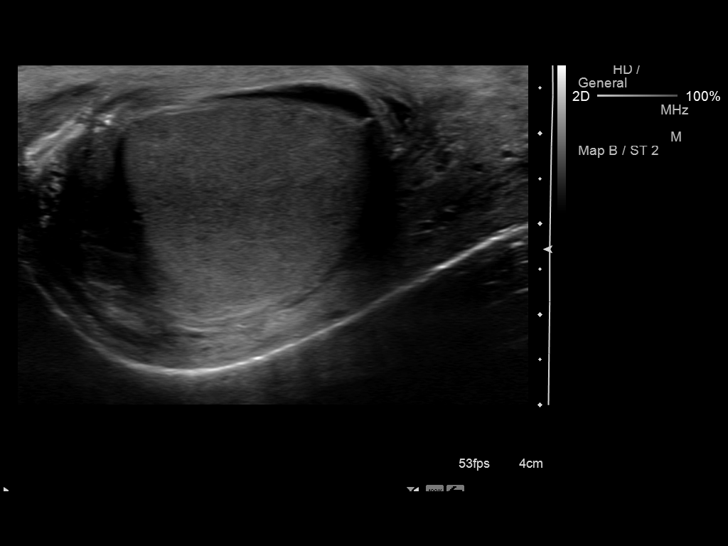
[im 11/31]
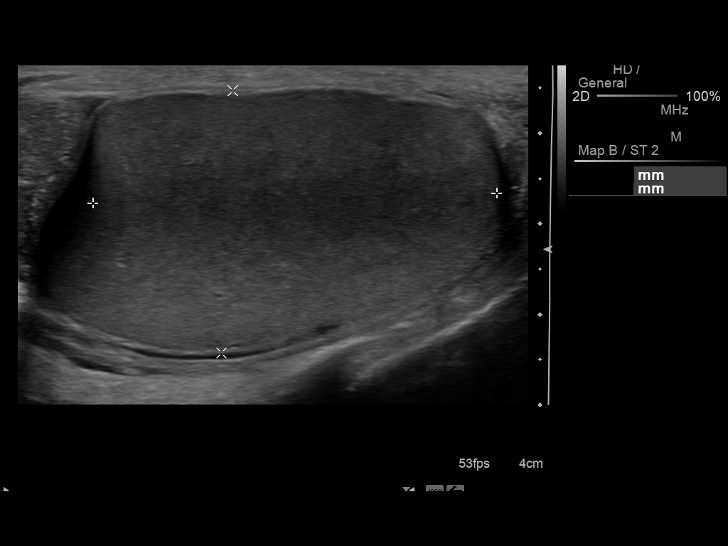
[im 13/31]
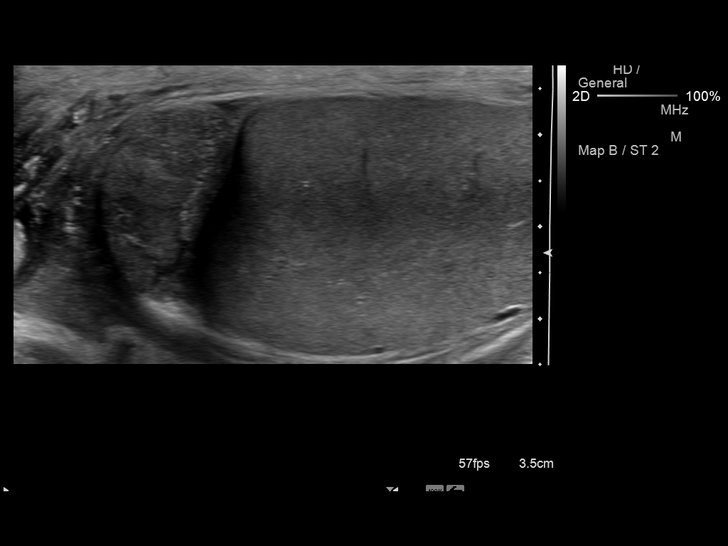
[im 16/31]
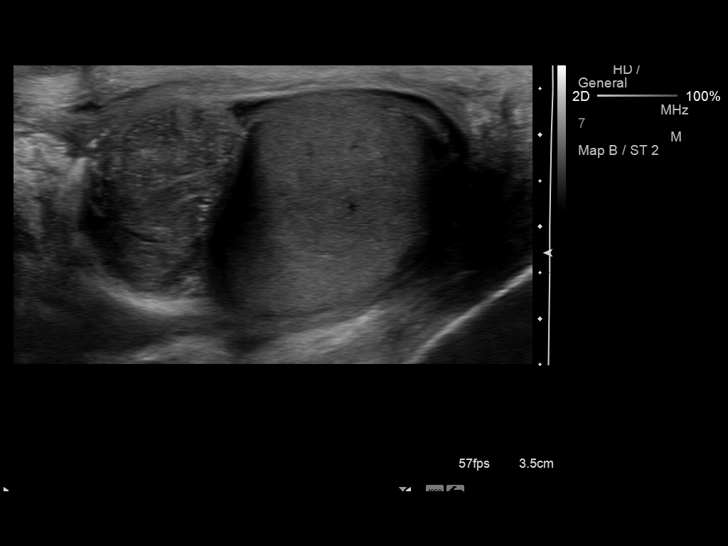
[im 18/31]
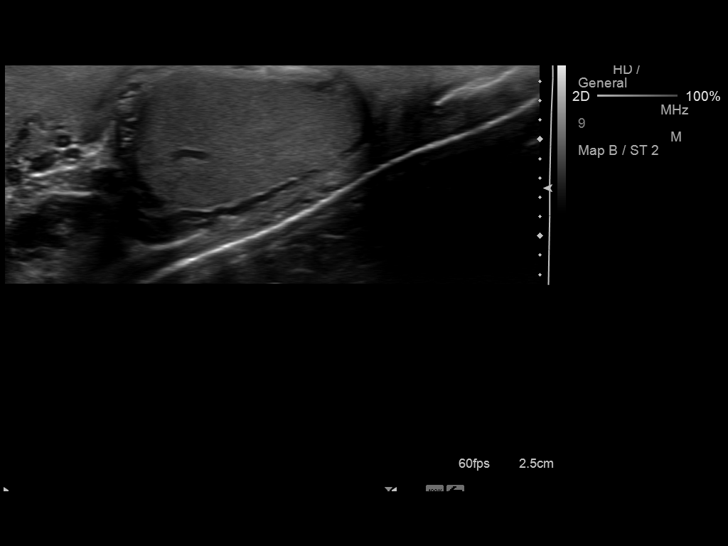
[im 21/31]
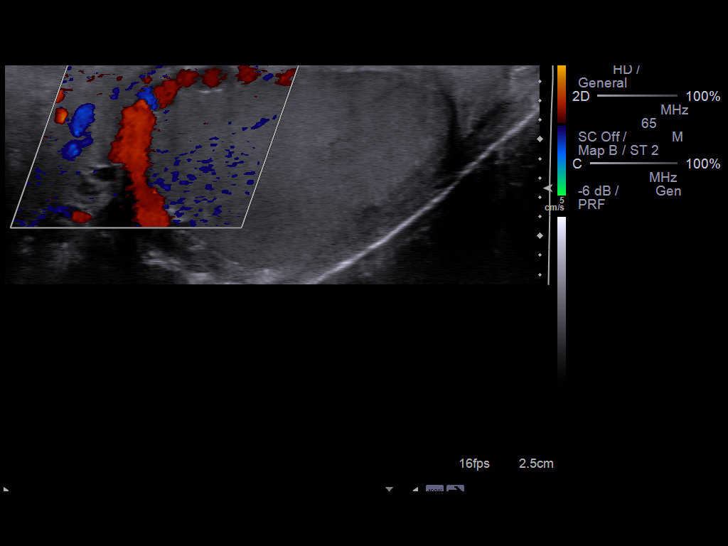
[im 23/31]
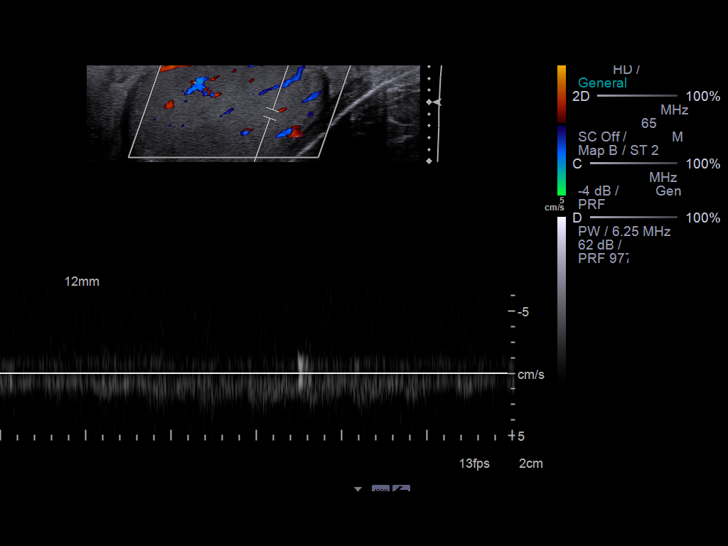
[im 26/31]
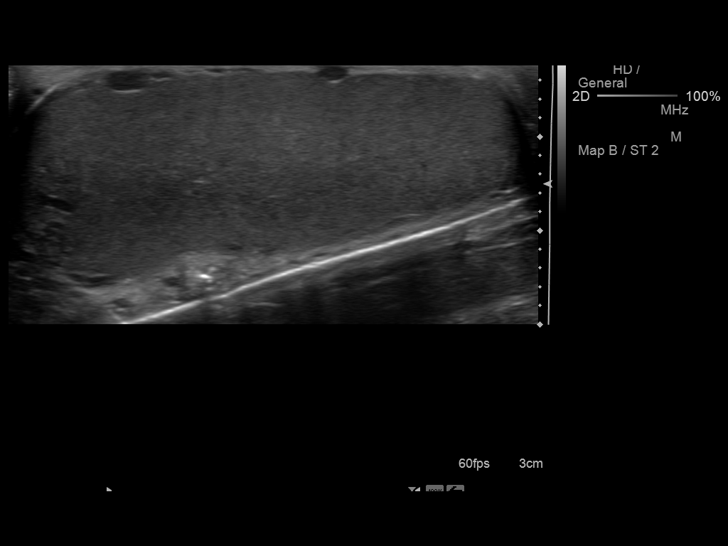
[im 28/31]
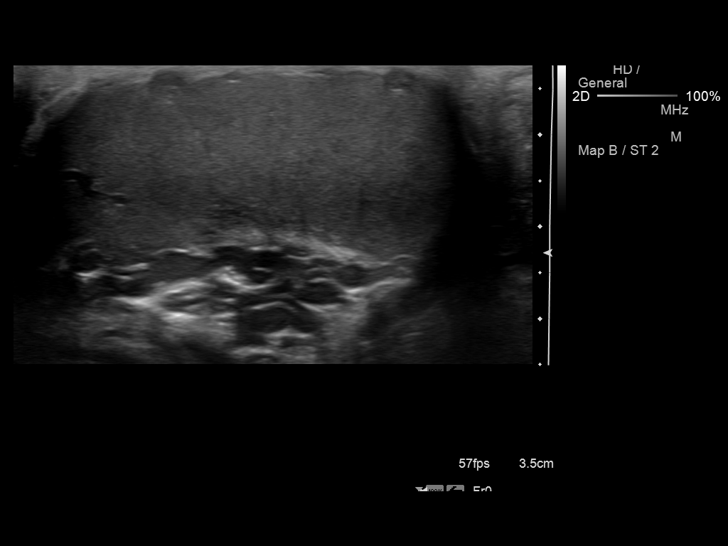
[im 31/31]
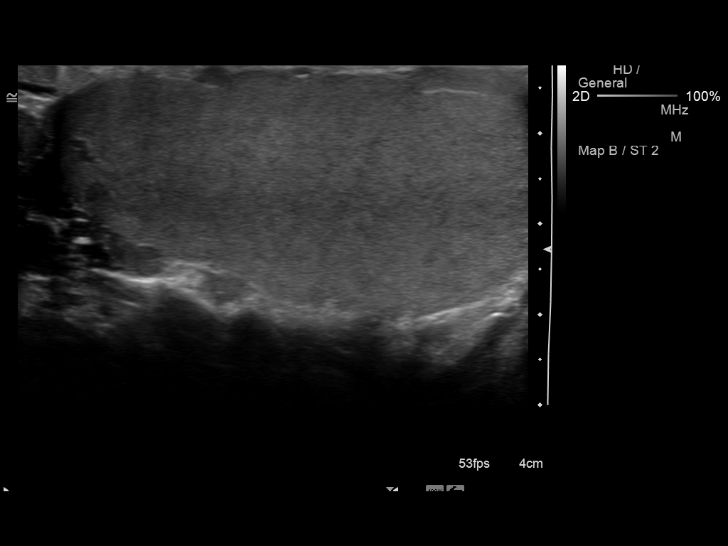

[13 of 25 positions shown; findings below may reference images not displayed]

FINDINGS: Right testis:  Normal in size and echotexture without focal
parenchymal abnormality, measuring approximately 5.1 x 3.0 x
cm.  Normal color Doppler flow.

Left testis:  Normal in size and echotexture without focal
parenchymal abnormality, measuring approximately 5.2 x 3.2 x
cm.  Normal color Doppler flow.

Right epididymis:  Enlarged head measuring approximately 2.2 x
x 1.5 cm.  Mild hyperemia on color Doppler evaluation.

Left epididymis:  Suboptimally visualized, but no evidence of
enlargement or hyperemia.

Hydrocele:  Absent bilaterally.

Varicocele:  Moderate sized left varicocele, increased in size
since 8333.  No right varicocele.

Pulsed Doppler interrogation of both testes demonstrates normal low
resistance arterial waveforms and normal venous waveforms
bilaterally
IMPRESSION: 1.  Moderately large left varicocele, increased in size since 8333.
This is likely the cause of the patient's left scrotal pain.
2.  Mildly enlarged right epididymal head with hyperemia.  This is
consistent with epididymitis in the proper clinical setting.
3.  Normal-appearing testes bilaterally.  No evidence of testicular
torsion.

## 2015-06-23 ENCOUNTER — Ambulatory Visit (INDEPENDENT_AMBULATORY_CARE_PROVIDER_SITE_OTHER): Payer: BLUE CROSS/BLUE SHIELD | Admitting: Family Medicine

## 2015-06-23 ENCOUNTER — Encounter: Payer: Self-pay | Admitting: Family Medicine

## 2015-06-23 VITALS — BP 144/89 | HR 100 | Temp 98.4°F | Wt 190.0 lb

## 2015-06-23 DIAGNOSIS — R197 Diarrhea, unspecified: Secondary | ICD-10-CM

## 2015-06-23 NOTE — Patient Instructions (Signed)
Thank you for coming in,   Most likely or diarrhea is from a viral illness.  I would stay well-hydrated using Gatorade and water.  I would also try using Imodium or Pepto-Bismol to help with the diarrhea.  If you continue to have the symptoms over the course of the next 5-7 days and you need to follow up with [place].   Sign up for My Chart to have easy access to your labs results, and communication with your Primary care physician   Please feel free to call with any questions or concerns at any time, at [phone]. --Dr. Raeford Razor Viral Gastroenteritis Viral gastroenteritis is also known as stomach flu. This condition affects the stomach and intestinal tract. It can cause sudden diarrhea and vomiting. The illness typically lasts 3 to 8 days. Most people develop an immune response that eventually gets rid of the virus. While this natural response develops, the virus can make you quite ill. CAUSES  Many different viruses can cause gastroenteritis, such as rotavirus or noroviruses. You can catch one of these viruses by consuming contaminated food or water. You may also catch a virus by sharing utensils or other personal items with an infected person or by touching a contaminated surface. SYMPTOMS  The most common symptoms are diarrhea and vomiting. These problems can cause a severe loss of body fluids (dehydration) and a body salt (electrolyte) imbalance. Other symptoms may include:  Fever.  Headache.  Fatigue.  Abdominal pain. DIAGNOSIS  Your caregiver can usually diagnose viral gastroenteritis based on your symptoms and a physical exam. A stool sample may also be taken to test for the presence of viruses or other infections. TREATMENT  This illness typically goes away on its own. Treatments are aimed at rehydration. The most serious cases of viral gastroenteritis involve vomiting so severely that you are not able to keep fluids down. In these cases, fluids must be given through an intravenous  line (IV). HOME CARE INSTRUCTIONS   Drink enough fluids to keep your urine clear or pale yellow. Drink small amounts of fluids frequently and increase the amounts as tolerated.  Ask your caregiver for specific rehydration instructions.  Avoid:  Foods high in sugar.  Alcohol.  Carbonated drinks.  Tobacco.  Juice.  Caffeine drinks.  Extremely hot or cold fluids.  Fatty, greasy foods.  Too much intake of anything at one time.  Dairy products until 24 to 48 hours after diarrhea stops.  You may consume probiotics. Probiotics are active cultures of beneficial bacteria. They may lessen the amount and number of diarrheal stools in adults. Probiotics can be found in yogurt with active cultures and in supplements.  Wash your hands well to avoid spreading the virus.  Only take over-the-counter or prescription medicines for pain, discomfort, or fever as directed by your caregiver. Do not give aspirin to children. Antidiarrheal medicines are not recommended.  Ask your caregiver if you should continue to take your regular prescribed and over-the-counter medicines.  Keep all follow-up appointments as directed by your caregiver. SEEK IMMEDIATE MEDICAL CARE IF:   You are unable to keep fluids down.  You do not urinate at least once every 6 to 8 hours.  You develop shortness of breath.  You notice blood in your stool or vomit. This may look like coffee grounds.  You have abdominal pain that increases or is concentrated in one small area (localized).  You have persistent vomiting or diarrhea.  You have a fever.  The patient is a child younger  than 3 months, and he or she has a fever.  The patient is a child older than 3 months, and he or she has a fever and persistent symptoms.  The patient is a child older than 3 months, and he or she has a fever and symptoms suddenly get worse.  The patient is a baby, and he or she has no tears when crying. MAKE SURE YOU:   Understand  these instructions.  Will watch your condition.  Will get help right away if you are not doing well or get worse. Document Released: 09/16/2005 Document Revised: 12/09/2011 Document Reviewed: 07/03/2011 [facility] Patient Information 2015 [place], [place]. This information is not intended to replace advice given to you by your health care provider. Make sure you discuss any questions you have with your health care provider.

## 2015-06-23 NOTE — Progress Notes (Signed)
   Subjective:    Patient ID: Tyler Krause, male    DOB: 06-May-1986, 29 y.o.   MRN: [id number]  Seen for Same day visit for   CC: diarrhea   Having diarrhea for 7 days Progression: staying the same with watery diarrhea regardless of eating  Stools per day: has had 4-5 stools today already. has 3-4 stools now but was having 1 per day normally  Does diarrhea wake patient: no Medications tried: no Recent travel: no Sick contacts: no. Having some nausea, myalgias this past week. Called out of work. Someone else at work with similar symptoms.  Ingested suspicious foods: no  Antibiotics recently: no Immunocompromised: no  Symptoms Vomiting: no. Having some nausea for two days  Abdominal pain: no Weight Loss: no Decreased urine output: no Lightheadedness: yes Fever: Tuesday reported  Bloody stools: no  Review of Systems   See HPI for ROS. Objective:  BP 144/89 mmHg  Pulse 100  Temp(Src) 98.4 F (36.9 C) (Oral)  Wt 190 lb (86.183 kg)  General: NAD  Respiratory: CTAB, normal effort Abdomen: soft, nontender, nondistended, no hepatic or splenomegaly. Bowel sounds present Extremities: WWP. Skin: warm and dry, no rashes noted Neuro: alert and oriented, no focal deficits     Assessment & Plan:   Acute diarrhea Most likely related to a viral origin. Acute and nature with no recent travel or suspicious foods Looks well on exam today. - Recommendations as recorded in the AVS

## 2015-06-23 NOTE — Assessment & Plan Note (Signed)
Most likely related to a viral origin. Acute and nature with no recent travel or suspicious foods Looks well on exam today. - Recommendations as recorded in the AVS

## 2015-09-04 ENCOUNTER — Encounter (HOSPITAL_COMMUNITY): Payer: Self-pay | Admitting: *Deleted

## 2015-09-04 ENCOUNTER — Emergency Department (HOSPITAL_COMMUNITY)
Admission: EM | Admit: 2015-09-04 | Discharge: 2015-09-04 | Disposition: A | Discharge status: Home / Self Care | Payer: BLUE CROSS/BLUE SHIELD | Attending: Emergency Medicine | Admitting: Emergency Medicine

## 2015-09-04 DIAGNOSIS — Z87891 Personal history of nicotine dependence: Secondary | ICD-10-CM | POA: Insufficient documentation

## 2015-09-04 DIAGNOSIS — Y998 Other external cause status: Secondary | ICD-10-CM | POA: Insufficient documentation

## 2015-09-04 DIAGNOSIS — Y9389 Activity, other specified: Secondary | ICD-10-CM | POA: Diagnosis not present

## 2015-09-04 DIAGNOSIS — Z880 Allergy status to penicillin: Secondary | ICD-10-CM | POA: Insufficient documentation

## 2015-09-04 DIAGNOSIS — R519 Headache, unspecified: Secondary | ICD-10-CM

## 2015-09-04 DIAGNOSIS — R51 Headache: Secondary | ICD-10-CM

## 2015-09-04 DIAGNOSIS — S0990XA Unspecified injury of head, initial encounter: Secondary | ICD-10-CM | POA: Insufficient documentation

## 2015-09-04 DIAGNOSIS — Y92410 Unspecified street and highway as the place of occurrence of the external cause: Secondary | ICD-10-CM | POA: Insufficient documentation

## 2015-09-04 NOTE — Discharge Instructions (Signed)
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
## 2015-09-04 NOTE — ED Notes (Signed)
Declined W/C at D/C and was escorted to lobby by RN. 

## 2017-02-14 ENCOUNTER — Ambulatory Visit (INDEPENDENT_AMBULATORY_CARE_PROVIDER_SITE_OTHER): Payer: Self-pay | Admitting: Physician Assistant

## 2017-02-14 ENCOUNTER — Encounter (INDEPENDENT_AMBULATORY_CARE_PROVIDER_SITE_OTHER): Payer: Self-pay | Admitting: Physician Assistant

## 2017-02-14 VITALS — BP 139/81 | HR 83 | Temp 98.0°F | Ht 69.5 in | Wt 201.4 lb

## 2017-02-14 DIAGNOSIS — Z114 Encounter for screening for human immunodeficiency virus [HIV]: Secondary | ICD-10-CM

## 2017-02-14 DIAGNOSIS — I10 Essential (primary) hypertension: Secondary | ICD-10-CM

## 2017-02-14 MED ORDER — HYDROCHLOROTHIAZIDE 12.5 MG PO TABS: 12.5000 mg | ORAL_TABLET | Freq: Every day | ORAL | 3 refills | Status: DC

## 2017-02-14 NOTE — Progress Notes (Signed)
Subjective:  Patient ID: Tyler Krause, male    DOB: 23-Oct-1985  Age: 31 y.o. MRN: [id number]  CC: blood pressure concerns  HPI Tyler Krause is a 31 y.o. male with a PMH of obesity and HTN presents for evaluation of blood pressure. 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 Has to conduct a physical at his new job and they were concerned about his blood pressure. Patient with borderline hypertension today. Has put forth a lot of effort in losing weight which has helped with his blood pressure. Denies CP, palpitations, SOB, HA, abdominal pain, LE edema, presyncope, syncope, or GI/GU sxs.        Outpatient Medications Prior to Visit  Medication Sig Dispense Refill  . doxycycline (VIBRAMYCIN) 100 MG capsule Take 1 capsule (100 mg total) by mouth 2 (two) times daily. One po bid x 7 days (Patient not taking: Reported on 02/14/2017) 14 capsule 0  . HYDROcodone-acetaminophen (NORCO/VICODIN) 5-325 MG per tablet Take 1 tablet by mouth every 6 (six) hours as needed for pain. (Patient not taking: Reported on 02/14/2017) 15 tablet 0  . ibuprofen (ADVIL,MOTRIN) 600 MG tablet Take 1 tablet (600 mg total) by mouth every 6 (six) hours as needed for pain. (Patient not taking: Reported on 02/14/2017) 30 tablet 0   No facility-administered medications prior to visit.      ROS Review of Systems  Constitutional: Negative for chills, fever and malaise/fatigue.  Eyes: Negative for blurred vision.  Respiratory: Negative for shortness of breath.   Cardiovascular: Negative for chest pain and palpitations.  Gastrointestinal: Negative for abdominal pain and nausea.  Genitourinary: Negative for dysuria and hematuria.  Musculoskeletal: Negative for joint pain and myalgias.  Skin: Negative for rash.  Neurological: Negative for tingling and headaches.  Psychiatric/Behavioral: Negative for depression. The patient is not nervous/anxious.     Objective:  BP 139/81  (BP Location: Right Arm, Patient Position: Sitting, Cuff Size: Large)   Pulse 83   Temp 98 F (36.7 C) (Oral)   Ht 5' 9.5" (1.765 m)   Wt 201 lb 6.4 oz (91.4 kg)   SpO2 98%   BMI 29.32 kg/m   BP/Weight 02/14/2017 09/04/2015 9/56/2130  Systolic BP 865 784 696  Diastolic BP 81 85 89  Wt. (Lbs) 201.4 - 190  BMI 29.32 - 27.45      Physical Exam  Constitutional: He is oriented to person, place, and time.  Well developed, overweight, NAD, polite  HENT:  Head: Normocephalic and atraumatic.  Eyes: No scleral icterus.  Neck: Normal range of motion. Neck supple. No thyromegaly present.  Cardiovascular: Normal rate, regular rhythm, normal heart sounds and intact distal pulses.   Pulmonary/Chest: Effort normal and breath sounds normal.  Musculoskeletal: He exhibits no edema.  Neurological: He is alert and oriented to person, place, and time.  Skin: Skin is warm and dry. No rash noted. No erythema. No pallor.  Psychiatric: He has a normal mood and affect. His behavior is normal. Thought content normal.  Vitals reviewed.    Assessment & Plan:   1. Hypertension, unspecified type - TSH - CBC with Differential - Comprehensive metabolic panel - hydrochlorothiazide (HYDRODIURIL) 12.5 MG tablet; Take 1 tablet (12.5 mg total) by mouth daily.  Dispense: 90 tablet; Refill: 3 - Lipid Panel  2. Encounter for screening for HIV - Pt declined to have HIV testing.   Meds ordered this encounter  Medications  . hydrochlorothiazide (HYDRODIURIL) 12.5 MG  tablet    Sig: Take 1 tablet (12.5 mg total) by mouth daily.    Dispense:  90 tablet    Refill:  3    Order Specific Question:   Supervising Provider    Answer:   Tresa Garter [id number]    Follow-up: Return in about 4 weeks (around 03/14/2017) for HTN.   Clent Demark PA

## 2017-02-14 NOTE — Patient Instructions (Signed)

## 2017-02-15 LAB — COMPREHENSIVE METABOLIC PANEL
A/G RATIO: 1.6 (ref 1.2–2.2)
ALT: 45 IU/L — AB (ref 0–44)
AST: 30 IU/L (ref 0–40)
Albumin: 4.8 g/dL (ref 3.5–5.5)
Alkaline Phosphatase: 71 IU/L (ref 39–117)
BUN/Creatinine Ratio: 30 — ABNORMAL HIGH (ref 9–20)
BUN: 28 mg/dL — ABNORMAL HIGH (ref 6–20)
Bilirubin Total: 0.3 mg/dL (ref 0.0–1.2)
CALCIUM: 9.7 mg/dL (ref 8.7–10.2)
CO2: 21 mmol/L (ref 18–29)
Chloride: 103 mmol/L (ref 96–106)
Creatinine, Ser: 0.93 mg/dL (ref 0.76–1.27)
GFR calc Af Amer: 128 mL/min/{1.73_m2} (ref >59)
GFR, EST NON AFRICAN AMERICAN: 111 mL/min/{1.73_m2} (ref >59)
GLOBULIN, TOTAL: 3 g/dL (ref 1.5–4.5)
Glucose: 80 mg/dL (ref 65–99)
POTASSIUM: 4.5 mmol/L (ref 3.5–5.2)
SODIUM: 143 mmol/L (ref 134–144)
Total Protein: 7.8 g/dL (ref 6.0–8.5)

## 2017-02-15 LAB — LIPID PANEL
CHOL/HDL RATIO: 3.3 ratio (ref 0.0–5.0)
CHOLESTEROL TOTAL: 193 mg/dL (ref 100–199)
HDL: 58 mg/dL (ref >39)
LDL Calculated: 116 mg/dL — ABNORMAL HIGH (ref 0–99)
TRIGLYCERIDES: 93 mg/dL (ref 0–149)
VLDL Cholesterol Cal: 19 mg/dL (ref 5–40)

## 2017-02-15 LAB — CBC WITH DIFFERENTIAL/PLATELET
BASOS: 0 %
Basophils Absolute: 0 10*3/uL (ref 0.0–0.2)
EOS (ABSOLUTE): 0.1 10*3/uL (ref 0.0–0.4)
EOS: 2 %
HEMATOCRIT: 47.7 % (ref 37.5–51.0)
HEMOGLOBIN: 16.4 g/dL (ref 13.0–17.7)
IMMATURE GRANS (ABS): 0 10*3/uL (ref 0.0–0.1)
IMMATURE GRANULOCYTES: 0 %
Lymphocytes Absolute: 2 10*3/uL (ref 0.7–3.1)
Lymphs: 35 %
MCH: 30.7 pg (ref 26.6–33.0)
MCHC: 34.4 g/dL (ref 31.5–35.7)
MCV: 89 fL (ref 79–97)
MONOCYTES: 8 %
MONOS ABS: 0.5 10*3/uL (ref 0.1–0.9)
Neutrophils Absolute: 3.1 10*3/uL (ref 1.4–7.0)
Neutrophils: 55 %
Platelets: 235 10*3/uL (ref 150–379)
RBC: 5.35 x10E6/uL (ref 4.14–5.80)
RDW: 13 % (ref 12.3–15.4)
WBC: 5.7 10*3/uL (ref 3.4–10.8)

## 2017-02-15 LAB — TSH: TSH: 0.635 u[IU]/mL (ref 0.450–4.500)

## 2018-02-27 ENCOUNTER — Emergency Department (HOSPITAL_COMMUNITY)
Admission: EM | Admit: 2018-02-27 | Discharge: 2018-02-28 | Disposition: A | Discharge status: Home / Self Care | Payer: Self-pay | Attending: Emergency Medicine | Admitting: Emergency Medicine

## 2018-02-27 ENCOUNTER — Encounter (HOSPITAL_COMMUNITY): Payer: Self-pay | Admitting: Emergency Medicine

## 2018-02-27 DIAGNOSIS — Z5321 Procedure and treatment not carried out due to patient leaving prior to being seen by health care provider: Secondary | ICD-10-CM | POA: Insufficient documentation

## 2018-02-27 DIAGNOSIS — R109 Unspecified abdominal pain: Secondary | ICD-10-CM | POA: Insufficient documentation

## 2018-02-27 HISTORY — DX: Essential (primary) hypertension: I10

## 2018-02-27 LAB — COMPREHENSIVE METABOLIC PANEL
ALK PHOS: 60 U/L (ref 38–126)
ALT: 24 U/L (ref 17–63)
ANION GAP: 8 (ref 5–15)
AST: 24 U/L (ref 15–41)
Albumin: 4.4 g/dL (ref 3.5–5.0)
BILIRUBIN TOTAL: 1 mg/dL (ref 0.3–1.2)
BUN: 13 mg/dL (ref 6–20)
CALCIUM: 9.1 mg/dL (ref 8.9–10.3)
CO2: 25 mmol/L (ref 22–32)
CREATININE: 1 mg/dL (ref 0.61–1.24)
Chloride: 107 mmol/L (ref 101–111)
GFR calc non Af Amer: >60 mL/min (ref >60)
Glucose, Bld: 88 mg/dL (ref 65–99)
Potassium: 3.5 mmol/L (ref 3.5–5.1)
SODIUM: 140 mmol/L (ref 135–145)
TOTAL PROTEIN: 7.6 g/dL (ref 6.5–8.1)

## 2018-02-27 LAB — CBC
HCT: 45 % (ref 39.0–52.0)
HEMOGLOBIN: 15.2 g/dL (ref 13.0–17.0)
MCH: 29.6 pg (ref 26.0–34.0)
MCHC: 33.8 g/dL (ref 30.0–36.0)
MCV: 87.7 fL (ref 78.0–100.0)
PLATELETS: 198 10*3/uL (ref 150–400)
RBC: 5.13 MIL/uL (ref 4.22–5.81)
RDW: 12 % (ref 11.5–15.5)
WBC: 7.8 10*3/uL (ref 4.0–10.5)

## 2018-02-27 LAB — URINALYSIS, ROUTINE W REFLEX MICROSCOPIC
BILIRUBIN URINE: NEGATIVE
Glucose, UA: NEGATIVE mg/dL
HGB URINE DIPSTICK: NEGATIVE
Ketones, ur: NEGATIVE mg/dL
Leukocytes, UA: NEGATIVE
NITRITE: NEGATIVE
PROTEIN: NEGATIVE mg/dL
Specific Gravity, Urine: 1.023 (ref 1.005–1.030)
pH: 6 (ref 5.0–8.0)

## 2018-02-27 LAB — LIPASE, BLOOD: Lipase: 25 U/L (ref 11–51)

## 2018-02-27 NOTE — ED Triage Notes (Signed)
Reports central abdominal pain today.  Thought he needed to eat.  Ate lunch then had diarrhea afterwards.  Describes pain as cramping/aching and has cold chills when the cramping starts.  Has had 3 stools.  Reports two loose and one semi solid.  Denies having any n/v.

## 2018-03-02 ENCOUNTER — Telehealth: Payer: Self-pay | Admitting: Physician Assistant

## 2018-03-02 NOTE — ED Notes (Signed)
Follow up call made  No answer  03/02/18  0955 s Miracle Criado rn

## 2018-03-02 NOTE — Telephone Encounter (Signed)
Pt called requesting an office visit with [place] ([place]) for a check up because he is having sweats, heart palpations, etc. Told pt he is no longer a pt at [facility] as he has transferred his care to [facility] and would have to schedule the appt with them. Pt stated he did call them and was told he couldn't be seen until sometime in July. Pt asked where he is supposed to be seen until July and I told him the Urgent Care or the ED would be able to see him. Pt said it's not an emergency so why should be go to those places. Pt did not understand we could not see him since he is no longer a pt with [place] and if he was to reestablish care with [place] his appt would be after the July date offered by [facility]. Pt then got verbally abusive on phone asking how/where is he supposed to be seen using multiple curse words and then just hung up.  Liberal

## 2018-03-06 ENCOUNTER — Other Ambulatory Visit: Payer: Self-pay

## 2018-03-06 ENCOUNTER — Encounter (HOSPITAL_COMMUNITY): Payer: Self-pay | Admitting: Emergency Medicine

## 2018-03-06 ENCOUNTER — Ambulatory Visit (HOSPITAL_COMMUNITY)
Admission: EM | Admit: 2018-03-06 | Discharge: 2018-03-06 | Disposition: A | Discharge status: Home / Self Care | Payer: Self-pay | Attending: Family Medicine | Admitting: Family Medicine

## 2018-03-06 DIAGNOSIS — R002 Palpitations: Secondary | ICD-10-CM

## 2018-03-06 NOTE — ED Provider Notes (Signed)
[facility]   [id number] 03/06/18 Arrival Time: 1242   SUBJECTIVE:  Tyler Krause is a 32 y.o. male hx significant for HTN, alcohol abuse, hx of tobacco dependence, and ADHD who presents with complaint of heart palpitations that began one year ago.  Denies a specific event or trauma, but states he first noticed his symptoms after taking a new blood pressure medication around the time his symptoms began.  He no longer takes that medication. He describes the episodes as "spurts of flutters" lasting approximately 20 minutes that occur 2-3 times daily.  Episodes are brought on by stress, caffeine, alcohol and spicy foods.  Has tried diet modification and decreasing coffee and alcohol intake as well as exercising and has noticed resolution of his symptoms.  Reports his symptoms were made worse later in the day and with eating spicy foods.  Denies worsening symptoms with postural changes or exertion.  Denies similar symptoms in the past.  Denies fever, chills, nausea, vomiting, chest pain, SOB, syncope, dizziness, abdominal pain, weakness, changes in bowel or bladder habits.    Cardiac risk factors: Male and HTN; denies tobacco use Patient's risk factors for DVT/PE: None. Denies calf pain, hormone use, SOB  The ASCVD Risk score Mikey Bussing DC Jr., et al., 2013) failed to calculate for the following reasons:   The 2013 ASCVD risk score is only valid for ages 8 to 36  Previous cardiac testing: denies hx of abnormal EKG.    ROS: As per HPI. Past Medical History:  Diagnosis Date  . Hypertension    Past Surgical History:  Procedure Laterality Date  . testicular torsion Right    Allergies  Allergen Reactions  . Penicillins Hives and Rash   No current facility-administered medications on file prior to encounter.    Current Outpatient Medications on File Prior to Encounter  Medication Sig Dispense Refill  . hydrochlorothiazide (HYDRODIURIL) 12.5 MG tablet Take 1 tablet (12.5 mg total) by  mouth daily. 90 tablet 3   Social History   Socioeconomic History  . Marital status: Single    Spouse name: Not on file  . Number of children: Not on file  . Years of education: Not on file  . Highest education level: Not on file  Occupational History  . Not on file  Social Needs  . Financial resource strain: Not on file  . Food insecurity:    Worry: Not on file    Inability: Not on file  . Transportation needs:    Medical: Not on file    Non-medical: Not on file  Tobacco Use  . Smoking status: Former Smoker    Last attempt to quit: 03/27/2009    Years since quitting: 8.9  . Smokeless tobacco: Never Used  Substance and Sexual Activity  . Alcohol use: Yes  . Drug use: No  . Sexual activity: Not on file  Lifestyle  . Physical activity:    Days per week: Not on file    Minutes per session: Not on file  . Stress: Not on file  Relationships  . Social connections:    Talks on phone: Not on file    Gets together: Not on file    Attends religious service: Not on file    Active member of club or organization: Not on file    Attends meetings of clubs or organizations: Not on file    Relationship status: Not on file  . Intimate partner violence:    Fear of current or ex partner:  Not on file    Emotionally abused: Not on file    Physically abused: Not on file    Forced sexual activity: Not on file  Other Topics Concern  . Not on file  Social History Narrative  . Not on file   History reviewed. No pertinent family history.   OBJECTIVE:  Vitals:   03/06/18 1300  BP: (!) 147/94  Pulse: 65  Temp: 98.7 F (37.1 C)  TempSrc: Oral  SpO2: 99%    General appearance: alert; no distress Eyes: PERRLA; EOMI; conjunctiva normal HENT: normocephalic; atraumatic Neck: supple Lungs: clear to auscultation bilaterally without adventitious breath sounds Heart: regular rate and rhythm.  Clear S1 and S2 without rubs, gallops, or murmur. Chest Wall: no heaves, lifts, or  thrills Abdomen: soft, non-tender; bowel sounds normal; no guarding Extremities: no cyanosis or edema; symmetrical with no gross deformities Skin: warm and dry Psychological: alert and cooperative; normal mood and affect  EKG:  RRR without LAD.  No widening PR interval or QRS complexes. No ST elevations or depressions.  Comparable to previous EKG.    Review EKG with Dr. Meda Coffee.  We are both in agreement that this EKG is unremarkable.    ASSESSMENT & PLAN:  1. Palpitations    EKG within normal limits.  No concerning features Please follow up with PCP next week for further evaluation and management Continue diet and activity modifications.  Avoid triggers that reproduce symptoms Return or go to the ER if you have any new or worsening symptoms such as chest pain, palpitations that last longer than usual, sweating, nausea, vomiting, shortness of breath, etc.  Chest pain precautions given. Reviewed expectations re: course of current medical issues. Questions answered. Outlined signs and symptoms indicating need for more acute intervention. Patient verbalized understanding. After Visit Summary given.   Lestine Box, PA-C 03/06/18 1431

## 2018-03-06 NOTE — ED Triage Notes (Signed)
Pt reports having palpitations for a year now.  He cannot say when exactly he gets them.

## 2018-03-06 NOTE — Discharge Instructions (Addendum)
EKG within normal limits.  No concerning features Please follow up with PCP next week for further evaluation and management Continue diet and activity modifications.  Avoid triggers that reproduce symptoms Return or go to the ER if you have any new or worsening symptoms such as chest pain, palpitations that last longer than usual, sweating, nausea, vomiting, shortness of breath, etc.

## 2018-11-18 ENCOUNTER — Ambulatory Visit (HOSPITAL_COMMUNITY)
Admission: EM | Admit: 2018-11-18 | Discharge: 2018-11-18 | Disposition: A | Discharge status: Home / Self Care | Payer: Self-pay | Attending: Family Medicine | Admitting: Family Medicine

## 2018-11-18 ENCOUNTER — Encounter (HOSPITAL_COMMUNITY): Payer: Self-pay | Admitting: Emergency Medicine

## 2018-11-18 DIAGNOSIS — M5432 Sciatica, left side: Secondary | ICD-10-CM

## 2018-11-18 DIAGNOSIS — M5431 Sciatica, right side: Secondary | ICD-10-CM

## 2018-11-18 MED ORDER — TRAMADOL HCL 50 MG PO TABS: 50.0000 mg | ORAL_TABLET | Freq: Two times a day (BID) | ORAL | 0 refills | Status: AC | PRN

## 2018-11-18 MED ORDER — PREDNISONE 10 MG (21) PO TBPK: ORAL_TABLET | ORAL | 0 refills | Status: DC

## 2018-11-18 NOTE — Discharge Instructions (Addendum)
We are treating you for sciatic nerve pain Prednisone taper over the next 6 days to help with pain inflammation Tramadol as needed every 12 hours for worsening pain.  Be aware this may make you drowsy. Gentle stretching, massage and exercises could help Follow up as needed for continued or worsening symptoms

## 2018-11-18 NOTE — ED Triage Notes (Signed)
Pt states hes having several lower mid back pain that travels down both sides of his hips and both legs. Painful bending over. Pain since Monday.

## 2018-11-19 NOTE — ED Provider Notes (Signed)
[place]    CSN: [id number] Arrival date & time: 11/18/18  1027     History   Chief Complaint Chief Complaint  Patient presents with  . Back Pain    HPI Tyler Krause is a 33 y.o. male.   Patient is a 33 year old male that presents with lower back pain that radiates down both upper leg since Monday.  The symptoms have been constant worsening.  Symptoms are exacerbated by going from sitting to standing position and laying flat.  He has been taking Tylenol for his pain without any relief.  He denies any injuries to the back.  He does do some repetitive movements and heavy lifting at work.  Denies any associated numbness, tingling, burning, weakness, saddle paresthesias or loss of bowel bladder function.  ROS per HPI\     Past Medical History:  Diagnosis Date  . Hypertension     Patient Active Problem List   Diagnosis Date Noted  . Acute diarrhea 06/23/2015  . OBESITY, NOS 11/27/2006  . ALCOHOL ABUSE, UNSPECIFIED 11/27/2006  . TOBACCO DEPENDENCE 11/27/2006  . ATTENTION DEFICIT, W/HYPERACTIVITY 11/27/2006  . HYPERTENSION, BENIGN SYSTEMIC 11/27/2006    Past Surgical History:  Procedure Laterality Date  . testicular torsion Right        Home Medications    Prior to Admission medications   Medication Sig Start Date End Date Taking? Authorizing Provider  predniSONE (STERAPRED UNI-PAK 21 TAB) 10 MG (21) TBPK tablet 6 tabs for 1 day, then 5 tabs for 1 das, then 4 tabs for 1 day, then 3 tabs for 1 day, 2 tabs for 1 day, then 1 tab for 1 day 11/18/18   Loura Halt A, NP  traMADol (ULTRAM) 50 MG tablet Take 1 tablet (50 mg total) by mouth every 12 (twelve) hours as needed for up to 3 days. 11/18/18 11/21/18  Orvan July, NP    Family History No family history on file.  Social History Social History   Tobacco Use  . Smoking status: Former Smoker    Last attempt to quit: 03/27/2009    Years since quitting: 9.6  . Smokeless tobacco: Never Used    Substance Use Topics  . Alcohol use: Yes  . Drug use: No     Allergies   Penicillins   Review of Systems Review of Systems   Physical Exam Triage Vital Signs ED Triage Vitals  Enc Vitals Group     BP 11/18/18 1118 (!) 131/102     Pulse Rate 11/18/18 1118 75     Resp 11/18/18 1118 18     Temp 11/18/18 1118 98.2 F (36.8 C)     Temp src --      SpO2 11/18/18 1118 100 %     Weight --      Height --      Head Circumference --      Peak Flow --      Pain Score 11/18/18 1119 10     Pain Loc --      Pain Edu? --      Excl. in [place]? --    No data found.  Updated Vital Signs BP (!) 131/102   Pulse 75   Temp 98.2 F (36.8 C)   Resp 18   SpO2 100%   Visual Acuity Right Eye Distance:   Left Eye Distance:   Bilateral Distance:    Right Eye Near:   Left Eye Near:    Bilateral Near:  Physical Exam Vitals signs and nursing note reviewed.  Constitutional:      General: He is not in acute distress.    Appearance: Normal appearance. He is not ill-appearing, toxic-appearing or diaphoretic.  HENT:     Head: Normocephalic and atraumatic.     Nose: Nose normal.  Neck:     Musculoskeletal: Normal range of motion.  Pulmonary:     Effort: Pulmonary effort is normal.  Musculoskeletal: Normal range of motion.     Comments: Pain elicited with flexion and extension of the spine.  Pain felt in the lower lumbar area.  No spinal tenderness. Positive straight leg raise in both legs. No swelling, bruising or deformities to the spine.  Skin:    General: Skin is warm and dry.  Neurological:     Mental Status: He is alert.     Sensory: Sensory deficit present.     Motor: No weakness.     Gait: Gait normal.  Psychiatric:        Mood and Affect: Mood normal.      UC Treatments / Results  Labs (all labs ordered are listed, but only abnormal results are displayed) Labs Reviewed - No data to display  EKG None  Radiology No results found.  Procedures Procedures  (including critical care time)  Medications Ordered in UC Medications - No data to display  Initial Impression / Assessment and Plan / UC Course  I have reviewed the triage vital signs and the nursing notes.  Pertinent labs & imaging results that were available during my care of the patient were reviewed by me and considered in my medical decision making (see chart for details).     Symptoms consistent with radiculopathy We will treat with prednisone taper over the next 6 days to help with the pain inflammation Tramadol as needed every 12 hours for more severe pain Stretching, massage and exercises as explained Follow up as needed for continued or worsening symptoms  Final Clinical Impressions(s) / UC Diagnoses   Final diagnoses:  Bilateral sciatica     Discharge Instructions     We are treating you for sciatic nerve pain Prednisone taper over the next 6 days to help with pain inflammation Tramadol as needed every 12 hours for worsening pain.  Be aware this may make you drowsy. Gentle stretching, massage and exercises could help Follow up as needed for continued or worsening symptoms     ED Prescriptions    Medication Sig Dispense Auth. Provider   predniSONE (STERAPRED UNI-PAK 21 TAB) 10 MG (21) TBPK tablet 6 tabs for 1 day, then 5 tabs for 1 das, then 4 tabs for 1 day, then 3 tabs for 1 day, 2 tabs for 1 day, then 1 tab for 1 day 21 tablet Riddick Nuon A, NP   traMADol (ULTRAM) 50 MG tablet Take 1 tablet (50 mg total) by mouth every 12 (twelve) hours as needed for up to 3 days. 6 tablet Loura Halt A, NP     Controlled Substance Prescriptions [place] Controlled Substance Registry consulted? Not Applicable   Orvan July, NP 11/19/18 [phone]

## 2019-05-10 ENCOUNTER — Other Ambulatory Visit: Payer: Self-pay

## 2019-05-10 ENCOUNTER — Encounter (INDEPENDENT_AMBULATORY_CARE_PROVIDER_SITE_OTHER): Payer: Self-pay | Admitting: Primary Care

## 2019-05-10 ENCOUNTER — Ambulatory Visit (INDEPENDENT_AMBULATORY_CARE_PROVIDER_SITE_OTHER): Payer: Self-pay | Admitting: Primary Care

## 2019-05-10 DIAGNOSIS — R197 Diarrhea, unspecified: Secondary | ICD-10-CM

## 2019-05-10 DIAGNOSIS — I10 Essential (primary) hypertension: Secondary | ICD-10-CM

## 2019-05-10 DIAGNOSIS — K219 Gastro-esophageal reflux disease without esophagitis: Secondary | ICD-10-CM

## 2019-05-10 DIAGNOSIS — R634 Abnormal weight loss: Secondary | ICD-10-CM

## 2019-05-10 MED ORDER — OMEPRAZOLE 20 MG PO CPDR: 20.0000 mg | DELAYED_RELEASE_CAPSULE | Freq: Every day | ORAL | 3 refills | Status: DC

## 2019-05-10 MED ORDER — LOPERAMIDE HCL 2 MG PO TABS: 2.0000 mg | ORAL_TABLET | Freq: Four times a day (QID) | ORAL | 0 refills | Status: DC | PRN

## 2019-05-10 NOTE — Progress Notes (Signed)
In the past 3-4 months patient has experienced re-occurring abdominal pain in the morning around 6am, increased diarrhea  In the past month he possibly had a hemorrhoid that had blood in it. Was causing discomfort. Has now subsided  Every other month patients right ear becomes clogged. Stays that way and nothing helps to unclog the ear.

## 2019-05-10 NOTE — Progress Notes (Signed)
Virtual Visit via Telephone Note  I connected with Tyler Krause on 05/10/19 at  1:30 PM EDT by telephone and verified that I am speaking with the correct person using two identifiers.   I discussed the limitations, risks, security and privacy concerns of performing an evaluation and management service by telephone and the availability of in person appointments. I also discussed with the patient that there may be a patient responsible charge related to this service. The patient expressed understanding and agreed to proceed.   History of Present Illness: [name] is having abdominal pain he has eliminated coffee and spicy foods. He is experience 4-5 loose stools a day. This has been taking place for 3 months.   Past Medical History:  Diagnosis Date  . Hypertension    Observations/Objective: Review of Systems  Gastrointestinal: Positive for abdominal pain and diarrhea.  Psychiatric/Behavioral: The patient is nervous/anxious.   All other systems reviewed and are negative.  Assessment and Plan: [name] was seen today for establish care.  Diagnoses and all orders for this visit:  Acute diarrhea 3-4 loose stools a day about to drink 64 oz of water or Gatorade no nausea or vomiting.Concerned with dehydration sent in over the counter imodium if continues he will need to be evaluated r/o IBS, h. Pyloric or  gastritis   Gastroesophageal reflux disease without esophagitis Discussed eating small frequent meal, reduction in acidic foods, fried foods ,spicy foods, alcohol caffeine and tobacco and certain medications. Avoid laying down after eating 67mins-1hour, elevated head of the bed.   HYPERTENSION, BENIGN SYSTEMIC No recent reading for Bp discussed Counseled on blood pressure goal of less than 130/80, low-sodium, DASH diet, medication compliance, 150 minutes of moderate intensity exercise per week. Discussed medication compliance, adverse effects. Currently not on any medications    Loss of weight Intentional states he has lost 40 lbs and feels a lot better and healthier  Other orders -     omeprazole (PRILOSEC) 20 MG capsule; Take 1 capsule (20 mg total) by mouth daily. -     loperamide (IMODIUM A-D) 2 MG tablet; Take 1 tablet (2 mg total) by mouth 4 (four) times daily as needed for diarrhea or loose stools.    Follow Up Instructions:    I discussed the assessment and treatment plan with the patient. The patient was provided an opportunity to ask questions and all were answered. The patient agreed with the plan and demonstrated an understanding of the instructions.   The patient was advised to call back or seek an in-person evaluation if the symptoms worsen or if the condition fails to improve as anticipated.  I provided 20 minutes of non-face-to-face time during this encounter.   Kerin Perna, NP

## 2020-05-14 ENCOUNTER — Other Ambulatory Visit: Payer: Self-pay

## 2020-05-14 ENCOUNTER — Emergency Department (HOSPITAL_COMMUNITY)
Admission: EM | Admit: 2020-05-14 | Discharge: 2020-05-14 | Disposition: A | Discharge status: Home / Self Care | Payer: Self-pay | Attending: Emergency Medicine | Admitting: Emergency Medicine

## 2020-05-14 ENCOUNTER — Encounter (HOSPITAL_COMMUNITY): Payer: Self-pay | Admitting: Emergency Medicine

## 2020-05-14 DIAGNOSIS — L02416 Cutaneous abscess of left lower limb: Secondary | ICD-10-CM | POA: Insufficient documentation

## 2020-05-14 DIAGNOSIS — Y999 Unspecified external cause status: Secondary | ICD-10-CM | POA: Insufficient documentation

## 2020-05-14 DIAGNOSIS — W57XXXA Bitten or stung by nonvenomous insect and other nonvenomous arthropods, initial encounter: Secondary | ICD-10-CM | POA: Insufficient documentation

## 2020-05-14 DIAGNOSIS — Y929 Unspecified place or not applicable: Secondary | ICD-10-CM | POA: Insufficient documentation

## 2020-05-14 DIAGNOSIS — Y939 Activity, unspecified: Secondary | ICD-10-CM | POA: Insufficient documentation

## 2020-05-14 DIAGNOSIS — Z5321 Procedure and treatment not carried out due to patient leaving prior to being seen by health care provider: Secondary | ICD-10-CM | POA: Insufficient documentation

## 2020-05-14 DIAGNOSIS — S80862A Insect bite (nonvenomous), left lower leg, initial encounter: Secondary | ICD-10-CM | POA: Insufficient documentation

## 2020-05-14 LAB — CBC WITH DIFFERENTIAL/PLATELET
Abs Immature Granulocytes: 0.05 10*3/uL (ref 0.00–0.07)
Basophils Absolute: 0 10*3/uL (ref 0.0–0.1)
Basophils Relative: 0 %
Eosinophils Absolute: 0 10*3/uL (ref 0.0–0.5)
Eosinophils Relative: 0 %
HCT: 46.6 % (ref 39.0–52.0)
Hemoglobin: 15.3 g/dL (ref 13.0–17.0)
Immature Granulocytes: 0 %
Lymphocytes Relative: 14 %
Lymphs Abs: 2.1 10*3/uL (ref 0.7–4.0)
MCH: 29.3 pg (ref 26.0–34.0)
MCHC: 32.8 g/dL (ref 30.0–36.0)
MCV: 89.1 fL (ref 80.0–100.0)
Monocytes Absolute: 1.2 10*3/uL — ABNORMAL HIGH (ref 0.1–1.0)
Monocytes Relative: 8 %
Neutro Abs: 11.6 10*3/uL — ABNORMAL HIGH (ref 1.7–7.7)
Neutrophils Relative %: 78 %
Platelets: 239 10*3/uL (ref 150–400)
RBC: 5.23 MIL/uL (ref 4.22–5.81)
RDW: 12.7 % (ref 11.5–15.5)
WBC: 15 10*3/uL — ABNORMAL HIGH (ref 4.0–10.5)
nRBC: 0 % (ref 0.0–0.2)

## 2020-05-14 LAB — BASIC METABOLIC PANEL
Anion gap: 11 (ref 5–15)
BUN: 10 mg/dL (ref 6–20)
CO2: 24 mmol/L (ref 22–32)
Calcium: 9.1 mg/dL (ref 8.9–10.3)
Chloride: 104 mmol/L (ref 98–111)
Creatinine, Ser: 0.98 mg/dL (ref 0.61–1.24)
GFR calc Af Amer: >60 mL/min (ref >60)
GFR calc non Af Amer: >60 mL/min (ref >60)
Glucose, Bld: 123 mg/dL — ABNORMAL HIGH (ref 70–99)
Potassium: 3.9 mmol/L (ref 3.5–5.1)
Sodium: 139 mmol/L (ref 135–145)

## 2020-05-14 NOTE — ED Triage Notes (Signed)
Pt. Stated, I had this bite started on Wed. Now its really painful and hard to walk. Lower left leg. Red, swollen.

## 2020-05-14 NOTE — ED Notes (Signed)
Called pt name x3 for VS recheck. No response from pt.

## 2020-06-27 ENCOUNTER — Other Ambulatory Visit: Payer: Medicaid Other

## 2020-06-27 DIAGNOSIS — Z20822 Contact with and (suspected) exposure to covid-19: Secondary | ICD-10-CM

## 2020-06-28 LAB — SARS-COV-2, NAA 2 DAY TAT

## 2020-06-28 LAB — NOVEL CORONAVIRUS, NAA: SARS-CoV-2, NAA: DETECTED — AB

## 2020-06-29 ENCOUNTER — Telehealth (HOSPITAL_COMMUNITY): Payer: Self-pay | Admitting: Adult Health

## 2020-06-29 NOTE — Telephone Encounter (Signed)
Called patient regarding monoclonal antibody treatment for COVID 19 given to those who are at risk for complications and/or hospitalization of the virus.  Patient meets criteria based on: HTN and bmi greater than 25.  I did not reach the patient and was unable to leave a message--his VM not yet set up.    My chart message: sent  Wilber Bihari, NP

## 2020-08-21 ENCOUNTER — Ambulatory Visit (INDEPENDENT_AMBULATORY_CARE_PROVIDER_SITE_OTHER): Payer: Self-pay | Admitting: Primary Care

## 2020-08-21 ENCOUNTER — Other Ambulatory Visit: Payer: Self-pay

## 2020-08-21 ENCOUNTER — Encounter (INDEPENDENT_AMBULATORY_CARE_PROVIDER_SITE_OTHER): Payer: Self-pay | Admitting: Primary Care

## 2020-08-21 ENCOUNTER — Ambulatory Visit (INDEPENDENT_AMBULATORY_CARE_PROVIDER_SITE_OTHER): Payer: Medicaid Other | Admitting: Primary Care

## 2020-08-21 VITALS — BP 131/79 | HR 86 | Temp 97.5°F | Ht 71.0 in | Wt 223.4 lb

## 2020-08-21 DIAGNOSIS — T148XXA Other injury of unspecified body region, initial encounter: Secondary | ICD-10-CM

## 2020-08-21 DIAGNOSIS — Z6830 Body mass index (BMI) 30.0-30.9, adult: Secondary | ICD-10-CM

## 2020-08-21 DIAGNOSIS — K580 Irritable bowel syndrome with diarrhea: Secondary | ICD-10-CM

## 2020-08-21 DIAGNOSIS — Z131 Encounter for screening for diabetes mellitus: Secondary | ICD-10-CM

## 2020-08-21 DIAGNOSIS — J302 Other seasonal allergic rhinitis: Secondary | ICD-10-CM

## 2020-08-21 DIAGNOSIS — Z7689 Persons encountering health services in other specified circumstances: Secondary | ICD-10-CM

## 2020-08-21 DIAGNOSIS — E6609 Other obesity due to excess calories: Secondary | ICD-10-CM

## 2020-08-21 LAB — POCT GLYCOSYLATED HEMOGLOBIN (HGB A1C): Hemoglobin A1C: 5.2 % (ref 4.0–5.6)

## 2020-08-21 MED ORDER — FLUTICASONE PROPIONATE 50 MCG/ACT NA SUSP: 2.0000 | Freq: Every day | NASAL | 6 refills | Status: DC

## 2020-08-21 MED ORDER — DICYCLOMINE HCL 10 MG PO CAPS: 10.0000 mg | ORAL_CAPSULE | Freq: Three times a day (TID) | ORAL | 1 refills | Status: DC

## 2020-08-21 NOTE — Progress Notes (Signed)
New Patient Office Visit  Subjective:  Patient ID: Tyler Krause, male    DOB: 17-Feb-1986  Age: 34 y.o. MRN: [id number]  CC:  Chief Complaint  Patient presents with  . New Patient (Initial Visit)    stomach issues    HPI Tyler Krause SUNDAY  Is 34 year old obese male who presents for establishment of care and voices concerns of chronic diarrhea, burning in his stomach and abdominal pain.  He drinks at least 64 ounces of water a day. He is aware of triggers spicy foods,  Coffee, caffeine and cheese. Past Medical History:  Diagnosis Date  . Hypertension     Past Surgical History:  Procedure Laterality Date  . testicular torsion Right     No family history on file.  Social History   Socioeconomic History  . Marital status: Single    Spouse name: Not on file  . Number of children: Not on file  . Years of education: Not on file  . Highest education level: Not on file  Occupational History  . Not on file  Tobacco Use  . Smoking status: Former Smoker    Quit date: 03/27/2009    Years since quitting: 11.4  . Smokeless tobacco: Never Used  Substance and Sexual Activity  . Alcohol use: Yes  . Drug use: No  . Sexual activity: Not on file  Other Topics Concern  . Not on file  Social History Narrative  . Not on file   Social Determinants of Health   Financial Resource Strain:   . Difficulty of Paying Living Expenses: Not on file  Food Insecurity:   . Worried About Charity fundraiser in the Last Year: Not on file  . Ran Out of Food in the Last Year: Not on file  Transportation Needs:   . Lack of Transportation (Medical): Not on file  . Lack of Transportation (Non-Medical): Not on file  Physical Activity:   . Days of Exercise per Week: Not on file  . Minutes of Exercise per Session: Not on file  Stress:   . Feeling of Stress : Not on file  Social Connections:   . Frequency of Communication with Friends and Family: Not on file  . Frequency of Social Gatherings  with Friends and Family: Not on file  . Attends Religious Services: Not on file  . Active Member of Clubs or Organizations: Not on file  . Attends Archivist Meetings: Not on file  . Marital Status: Not on file  Intimate Partner Violence:   . Fear of Current or Ex-Partner: Not on file  . Emotionally Abused: Not on file  . Physically Abused: Not on file  . Sexually Abused: Not on file    ROS Review of Systems  Respiratory: Positive for chest tightness.        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  Neurological: Positive for headaches.       When stomach pain  All other systems reviewed and are negative.   Objective:   Today's Vitals: BP 131/79 (BP Location: Right Arm, Patient Position: Sitting, Cuff Size: Large)   Pulse 86   Temp (!) 97.5 F (36.4 C) (Temporal)   Ht 5\' 11"  (1.803 m)   Wt 223 lb 6.4 oz (101.3 kg)   SpO2 98%   BMI 31.16 kg/m   Physical Exam Vitals reviewed.  Constitutional:  Appearance: He is obese.  HENT:     Head: Normocephalic.     Right Ear: Tympanic membrane normal.     Left Ear: Tympanic membrane normal.  Eyes:     Extraocular Movements: Extraocular movements intact.     Pupils: Pupils are equal, round, and reactive to light.  Cardiovascular:     Rate and Rhythm: Normal rate and regular rhythm.  Pulmonary:     Effort: Pulmonary effort is normal.     Breath sounds: Normal breath sounds.  Abdominal:     General: Bowel sounds are normal.     Palpations: Abdomen is soft.  Musculoskeletal:        General: Normal range of motion.     Cervical back: Normal range of motion and neck supple.  Skin:    General: Skin is warm and dry.  Neurological:     Mental Status: He is alert and oriented to person, place, and time.  Psychiatric:        Mood and Affect: Mood normal.        Behavior: Behavior normal.        Thought Content: Thought content normal.         Judgment: Judgment normal.     Assessment & Plan:  Vicky was seen today for new patient (initial visit).  Diagnoses and all orders for this visit:  Encounter to establish care Establishing care with new provider   Screening for diabetes mellitus -     HgB A1c 5.2 per ADA guidelines patient is not pre nor diabetic  Irritable bowel syndrome with diarrhea -     dicyclomine (BENTYL) 10 MG capsule; Take 1 capsule (10 mg total) by mouth 3 (three) times daily before meals. If no improvement will refer to GI  Muscle strain Lifting , bending and pulling all day proper demonstrated proper  body mechanics   Seasonal allergies It appears that you are struggling with allergies.  Fortunately, this is a common condition and can usually be managed with medication. Currently taking Claritin 10 mg daily and prescribed Flonase.  Class 1 obesity due to excess calories without serious comorbidity with body mass index (BMI) of 30.0 to 30.9 in adult Discussed BMI being obese monitor sweets, carbohydrates and increase exercising to try to avoid comorbidities such as hypertension, respiratory complications and diabetes.  Outpatient Encounter Medications as of 08/21/2020  Medication Sig  . dicyclomine (BENTYL) 10 MG capsule Take 1 capsule (10 mg total) by mouth 3 (three) times daily before meals.  . fluticasone (FLONASE) 50 MCG/ACT nasal spray Place 2 sprays into both nostrils daily.  . [DISCONTINUED] loperamide (IMODIUM A-D) 2 MG tablet Take 1 tablet (2 mg total) by mouth 4 (four) times daily as needed for diarrhea or loose stools.  . [DISCONTINUED] omeprazole (PRILOSEC) 20 MG capsule Take 1 capsule (20 mg total) by mouth daily.   No facility-administered encounter medications on file as of 08/21/2020.    Follow-up: Return in about 6 months (around 02/18/2021) for IBS/ re-evaluate.   Kerin Perna, NP

## 2020-08-21 NOTE — Progress Notes (Signed)
Wakes up with intense burning in his stomach  Constant diarrhea  Causes him to feel really weak

## 2020-08-21 NOTE — Patient Instructions (Addendum)
Diet for Irritable Bowel Syndrome When you have irritable bowel syndrome (IBS), it is very important to eat the foods and follow the eating habits that are best for your condition. IBS may cause various symptoms such as pain in the abdomen, constipation, or diarrhea. Choosing the right foods can help to ease the discomfort from these symptoms. Work with your health care provider and diet and nutrition specialist (dietitian) to find the eating plan that will help to control your symptoms. What are tips for following this plan?      Keep a food diary. This will help you identify foods that cause symptoms. Write down: ? What you eat and when you eat it. ? What symptoms you have. ? When symptoms occur in relation to your meals, such as "pain in abdomen 2 hours after dinner."  Eat your meals slowly and in a relaxed setting.  Aim to eat 5-6 small meals per day. Do not skip meals.  Drink enough fluid to keep your urine pale yellow.  Ask your health care provider if you should take an over-the-counter probiotic to help restore healthy bacteria in your gut (digestive tract). ? Probiotics are foods that contain good bacteria and yeasts.  Your dietitian may have specific dietary recommendations for you based on your symptoms. He or she may recommend that you: ? Avoid foods that cause symptoms. Talk with your dietitian about other ways to get the same nutrients that are in those problem foods. ? Avoid foods with gluten. Gluten is a protein that is found in rye, wheat, and barley. ? Eat more foods that contain soluble fiber. Examples of foods with high soluble fiber include oats, seeds, and certain fruits and vegetables. Take a fiber supplement if directed by your dietitian. ? Reduce or avoid certain foods called FODMAPs. These are foods that contain carbohydrates that are hard to digest. Ask your doctor which foods contain these carbohydrates. What foods are not recommended? The following are some  foods and drinks that may make your symptoms worse:  Fatty foods, such as french fries.  Foods that contain gluten, such as pasta and cereal.  Dairy products, such as milk, cheese, and ice cream.  Chocolate.  Alcohol.  Products with caffeine, such as coffee.  Carbonated drinks, such as soda.  Foods that are high in FODMAPs. These include certain fruits and vegetables.  Products with sweeteners such as honey, high fructose corn syrup, sorbitol, and mannitol. The items listed above may not be a complete list of foods and beverages you should avoid. Contact a dietitian for more information. What foods are good sources of fiber? Your health care provider or dietitian may recommend that you eat more foods that contain fiber. Fiber can help to reduce constipation and other IBS symptoms. Add foods with fiber to your diet a little at a time so your body can get used to them. Too much fiber at one time might cause gas and swelling of your abdomen. The following are some foods that are good sources of fiber:  Berries, such as raspberries, strawberries, and blueberries.  Tomatoes.  Carrots.  Brown rice.  Oats.  Seeds, such as chia and pumpkin seeds. The items listed above may not be a complete list of recommended sources of fiber. Contact your dietitian for more options. Where to find more information  International Foundation for Functional Gastrointestinal Disorders: www.iffgd.org  National Institute of Diabetes and Digestive and Kidney Diseases: www.niddk.nih.gov Summary  When you have irritable bowel syndrome (IBS), it is   very important to eat the foods and follow the eating habits that are best for your condition.  IBS may cause various symptoms such as pain in the abdomen, constipation, or diarrhea.  Choosing the right foods can help to ease the discomfort that comes from symptoms.  Keep a food diary. This will help you identify foods that cause symptoms.  Your health  care provider or diet and nutrition specialist (dietitian) may recommend that you eat more foods that contain fiber. This information is not intended to replace advice given to you by your health care provider. Make sure you discuss any questions you have with your health care provider. Document Revised: 01/06/2019 Document Reviewed: 05/20/2017 Elsevier Patient Education  2020 [employer].   Muscle strain  Before lifting 1. Stretch and warm up your muscles before lifting or moving. Cold and stiff muscles get injured more easily. 2. Talk to the person about what you are going to do. This may help him or her assist you with the lifting. While lifting 1. Always keep your head, shoulders, and back aligned. Twisting is a major cause of injury. 2. If you need to move while lifting, shift your feet in small steps while keeping your back straight. 3. Do not let a person you are lifting lock arms around your neck. This can make you lose your balance or fall forward, and can place strain on your neck. 4. Always keep your back as straight as possible. Bending at the waist or leaning over is a major cause of back injury. 5. When you are lifting a person or a heavy object, do not lift with your arms extended. Keep the person or object close to your body. 6. Keep your feet spread shoulder-width apart, with one foot slightly in front of the other. This helps you maintain balance within your center of gravity when lifting. 7. Use the big muscles in your legs and arms instead of your back muscles to lift or pull. Tighten your belly muscles to help support your back. 8. If you have trouble lifting a person, or the person fights you or is stuck in a difficult position, do not try to lift alone. Get help. 9. If you feel pain while lifting, stop and get help. How to help someone sit up, stand up, or sit down Helping someone sit up, stand up, or sit down involves both lifting and turning. These movements require  special techniques. Do not bend, twist, or lift with your back muscles. To help someone sit up to get out of bed: 1. Place the chair or wheelchair close to the bed. Make sure the wheelchair wheels are locked. 2. Place one arm under the person's shoulders and the other arm under the person's knees. 3. Keep your feet spread shoulder-width apart and bend at your knees, not your back. 4. Keep your back straight, and turn the person so that their legs come out over the bed and their upper body comes up straight into a sitting position. Things to do: Marland Kitchen Take the medications prescribed daily as directed.   . Try not to skip days.  If you have received a nasal spray today, make sure to use it consistently as directed.  Some patients see improvement in symptoms in as little as a couple of days but for maximum effect, nasal sprays can take up to 2 weeks of consistent use.  . Use a saline nasal spray (like Ocean) or rinse (Netipot).  This will help remove allergens from  your nasal passage and reduce allergy symptoms.  Use the nasal saline before your prescription nasal spray if I have prescribed you one. . If your symptoms are uncontrolled after 2 weeks of consistent use of your allergy medication, please return for reevaluation.   Allergies An allergy is when your body's defense system (immune system) overreacts to an otherwise harmless substance (allergen) that you breathe in or eat or something that touches your skin. When you come into contact with something that you are allergic to, your immune system produces certain proteins (antibodies). These proteins cause cells to release chemicals (histamines) that trigger the symptoms of an allergic reaction. Allergies often affect the nasal passages (allergic rhinitis), eyes (allergic conjunctivitis), skin (atopic dermatitis), and stomach. Allergies can be mild or severe. Allergies cannot spread from person to person (are not contagious). They can develop at any age  and may be outgrown. What are the causes? Allergies can be caused by any substance that your immune system mistakenly targets as harmful. These may include:  Outdoor allergens, such as pollen, grass, weeds, car exhaust, and mold spores.  Indoor allergens, such as dust, smoke, mold, and pet dander.  Foods, especially peanuts, milk, eggs, fish, shellfish, soy, nuts, and wheat.  Medicines, such as penicillin.  Skin irritants, such as detergents, chemicals, and latex.  Perfume.  Insect bites or stings. What increases the risk? You may be at greater risk of allergies if other people in your family have allergies. What are the signs or symptoms? Symptoms depend on what type of allergy you have. They may include:  Runny, stuffy nose.  Sneezing.  Itchy mouth, ears, or throat.  Postnasal drip.  Sore throat.  Itchy, red, watery, or puffy eyes.  Skin rash or hives.  Stomach pain.  Vomiting.  Diarrhea.  Bloating.  Wheezing or coughing. People with a severe allergy to food, medicine, or an insect bite may have a life-threatening allergic reaction (anaphylaxis). Symptoms of anaphylaxis include:  Hives.  Itching.  Flushed face.  Swollen lips, tongue, or mouth.  Tight or swollen throat.  Chest pain or tightness in the chest.  Trouble breathing or shortness of breath.  Rapid heartbeat.  Dizziness or fainting.  Vomiting.  Diarrhea.  Pain in the abdomen. How is this diagnosed? This condition is diagnosed based on:  Your symptoms.  Your family and medical history.  A physical exam. You may need to see a health care provider who specializes in treating allergies (allergist). You may also have tests, including:  Skin tests to see which allergens are causing your symptoms, such as:  Skin prick test. In this test, your skin is pricked with a tiny needle and exposed to small amounts of possible allergens to see if your skin reacts.  Intradermal skin test. In  this test, a small amount of allergen is injected under your skin to see if your skin reacts.  Patch test. In this test, a small amount of allergen is placed on your skin and then your skin is covered with a bandage. Your health care provider will check your skin after a couple of days to see if a rash has developed.  Blood tests.  Challenges tests. In this test, you inhale a small amount of allergen by mouth to see if you have an allergic reaction. You may also be asked to:  Keep a food diary. A food diary is a record of all the foods and drinks you have in a day and any symptoms you experience.  Practice an elimination diet. An elimination diet involves eliminating specific foods from your diet and then adding them back in one by one to find out if a certain food causes an allergic reaction. How is this treated? Treatment for allergies depends on your symptoms. Treatment may include:  Cold compresses to soothe itching and swelling.  Eye drops.  Nasal sprays.  Using a saline spray or container (neti pot) to flush out the nose (nasal irrigation). These methods can help clear away mucus and keep the nasal passages moist.  Using a humidifier.  Oral antihistamines or other medicines to block allergic reaction and inflammation.  Skin creams to treat rashes or itching.  Diet changes to eliminate food allergy triggers.  Repeated exposure to tiny amounts of allergens to build up a tolerance and prevent future allergic reactions (immunotherapy). These include:  Allergy shots.  Oral treatment. This involves taking small doses of an allergen under the tongue (sublingual immunotherapy).  Emergency epinephrine injection (auto-injector) in case of an allergic emergency. This is a self-injectable, pre-measured medicine that must be given within the first few minutes of a serious allergic reaction. Follow these instructions at home:  Avoid known allergens whenever possible.  If you suffer  from airborne allergens, wash out your nose daily. You can do this with a saline spray or a neti pot to flush out your nose (nasal irrigation).  Take over-the-counter and prescription medicines only as told by your health care provider.  Keep all follow-up visits as told by your health care provider. This is important.  If you are at risk of a severe allergic reaction (anaphylaxis), keep your auto-injector with you at all times.  If you have ever had anaphylaxis, wear a medical alert bracelet or necklace that states you have a severe allergy. Contact a health care provider if:  Your symptoms do not improve with treatment. Get help right away if:  You have symptoms of anaphylaxis, such as:  Swollen mouth, tongue, or throat.  Pain or tightness in your chest.  Trouble breathing or shortness of breath.  Dizziness or fainting.  Severe abdominal pain, vomiting, or diarrhea. This information is not intended to replace advice given to you by your health care provider. Make sure you discuss any questions you have with your health care provider. Document Released: 12/10/2002 Document Revised: 05/16/2016 Document Reviewed: 04/03/2016 Elsevier Interactive Patient Education  2017 Elsevier Inc. -     fluticasone (FLONASE) 50 MCG/ACT nasal spray; Place 2 sprays into both nostrils daily.

## 2021-02-15 ENCOUNTER — Encounter (INDEPENDENT_AMBULATORY_CARE_PROVIDER_SITE_OTHER): Payer: Self-pay

## 2021-02-19 ENCOUNTER — Encounter (INDEPENDENT_AMBULATORY_CARE_PROVIDER_SITE_OTHER): Payer: Self-pay

## 2021-02-19 ENCOUNTER — Ambulatory Visit (INDEPENDENT_AMBULATORY_CARE_PROVIDER_SITE_OTHER): Payer: Medicaid Other | Admitting: Family Medicine

## 2021-03-07 ENCOUNTER — Encounter (INDEPENDENT_AMBULATORY_CARE_PROVIDER_SITE_OTHER): Payer: Self-pay | Admitting: Primary Care

## 2021-03-07 ENCOUNTER — Other Ambulatory Visit: Payer: Self-pay

## 2021-03-07 ENCOUNTER — Telehealth (INDEPENDENT_AMBULATORY_CARE_PROVIDER_SITE_OTHER): Payer: 59 | Admitting: Primary Care

## 2021-03-07 DIAGNOSIS — K219 Gastro-esophageal reflux disease without esophagitis: Secondary | ICD-10-CM

## 2021-03-07 DIAGNOSIS — F1010 Alcohol abuse, uncomplicated: Secondary | ICD-10-CM

## 2021-03-07 MED ORDER — OMEPRAZOLE 20 MG PO CPDR: 20.0000 mg | DELAYED_RELEASE_CAPSULE | Freq: Every day | ORAL | 3 refills | Status: DC

## 2021-03-07 NOTE — Progress Notes (Signed)
  [facility]  Virtual Visit via Telephone Note  I connected with Tyler Krause, on 03/11/2021 at 9:22 PM by telephone due to the and verified that I am speaking with the correct person using two identifiers.   Consent: I discussed the limitations, risks, security and privacy concerns of performing an evaluation and management service by telephone and the availability of in person appointments. I also discussed with the patient that there may be a patient responsible charge related to this service. The patient expressed understanding and agreed to proceed.   Location of Patient: Work   Biomedical scientist of Provider: [facility]   Persons participating in Telemedicine visit: Jilda Roche,  NP [place] [facility] , CMA  History of Present Illness: [name] is a 35 year old male that has acid reflux -starts burping , notice when he is hungry he gets headaches and abdominal pain.  Struggled with alcohol abuse in his 20's- concerns if any liver damage.    Past Medical History:  Diagnosis Date   Hypertension    Allergies  Allergen Reactions   Penicillins Hives and Rash    Current Outpatient Medications on File Prior to Visit  Medication Sig Dispense Refill   fluticasone (FLONASE) 50 MCG/ACT nasal spray Place 2 sprays into both nostrils daily. (Patient not taking: Reported on 03/07/2021) 16 g 6   No current facility-administered medications on file prior to visit.    Observations/Objective: There were no vitals taken for this visit.   Assessment and Plan: [name] was seen today for irritable bowel syndrome.  Diagnoses and all orders for this visit:  Gastroesophageal reflux disease without esophagitis Discussed eating small frequent meal, reduction in acidic foods, fried foods ,spicy foods, alcohol caffeine and tobacco and certain medications. Avoid laying down after eating 36mins-1hour,  elevated head of the bed.  -     omeprazole (PRILOSEC) 20 MG capsule; Take 1 capsule (20 mg total) by mouth daily.  Alcohol abuse -     CMP14+EGFR; Future    Follow Up Instructions:    I discussed the assessment and treatment plan with the patient. The patient was provided an opportunity to ask questions and all were answered. The patient agreed with the plan and demonstrated an understanding of the instructions.   The patient was advised to call back or seek an in-person evaluation if the symptoms worsen or if the condition fails to improve as anticipated.     I provided 14 minutes total of non-face-to-face time during this encounter including median intraservice time, reviewing previous notes, investigations, ordering medications, medical decision making, coordinating care and patient verbalized understanding at the end of the visit.    This note has been created with Surveyor, quantity. Any transcriptional errors are unintentional.   Kerin Perna, NP 03/11/2021, 9:22 PM

## 2021-03-07 NOTE — Progress Notes (Signed)
Pt may need referral to gastro

## 2021-03-14 ENCOUNTER — Other Ambulatory Visit (INDEPENDENT_AMBULATORY_CARE_PROVIDER_SITE_OTHER): Payer: 59

## 2021-03-14 ENCOUNTER — Encounter (INDEPENDENT_AMBULATORY_CARE_PROVIDER_SITE_OTHER): Payer: Self-pay

## 2021-03-14 ENCOUNTER — Other Ambulatory Visit: Payer: Self-pay

## 2021-03-14 DIAGNOSIS — F1010 Alcohol abuse, uncomplicated: Secondary | ICD-10-CM

## 2021-03-15 LAB — CMP14+EGFR
ALT: 34 IU/L (ref 0–44)
AST: 28 IU/L (ref 0–40)
Albumin/Globulin Ratio: 1.6 (ref 1.2–2.2)
Albumin: 4.7 g/dL (ref 4.0–5.0)
Alkaline Phosphatase: 69 IU/L (ref 44–121)
BUN/Creatinine Ratio: 12 (ref 9–20)
BUN: 12 mg/dL (ref 6–20)
Bilirubin Total: 0.5 mg/dL (ref 0.0–1.2)
CO2: 21 mmol/L (ref 20–29)
Calcium: 9.5 mg/dL (ref 8.7–10.2)
Chloride: 104 mmol/L (ref 96–106)
Creatinine, Ser: 0.97 mg/dL (ref 0.76–1.27)
Globulin, Total: 3 g/dL (ref 1.5–4.5)
Glucose: 120 mg/dL — ABNORMAL HIGH (ref 65–99)
Potassium: 4.5 mmol/L (ref 3.5–5.2)
Sodium: 141 mmol/L (ref 134–144)
Total Protein: 7.7 g/dL (ref 6.0–8.5)
eGFR: 106 mL/min/{1.73_m2} (ref >59)

## 2021-06-07 ENCOUNTER — Encounter: Payer: Self-pay | Admitting: Gastroenterology

## 2021-06-29 ENCOUNTER — Encounter: Payer: Self-pay | Admitting: Gastroenterology

## 2021-06-29 ENCOUNTER — Ambulatory Visit: Payer: 59 | Admitting: Gastroenterology

## 2021-06-29 ENCOUNTER — Other Ambulatory Visit: Payer: 59

## 2021-06-29 VITALS — BP 120/70 | HR 72 | Ht 71.0 in | Wt 233.0 lb

## 2021-06-29 DIAGNOSIS — Z800 Family history of malignant neoplasm of digestive organs: Secondary | ICD-10-CM

## 2021-06-29 DIAGNOSIS — K219 Gastro-esophageal reflux disease without esophagitis: Secondary | ICD-10-CM | POA: Diagnosis not present

## 2021-06-29 DIAGNOSIS — K580 Irritable bowel syndrome with diarrhea: Secondary | ICD-10-CM | POA: Diagnosis not present

## 2021-06-29 DIAGNOSIS — K921 Melena: Secondary | ICD-10-CM

## 2021-06-29 MED ORDER — SUTAB 1479-225-188 MG PO TABS: 1.0000 | ORAL_TABLET | Freq: Once | ORAL | 0 refills | Status: AC

## 2021-06-29 NOTE — Progress Notes (Signed)
HPI : Tyler Krause is pleasant 35 year old male referred to [place] by Juluis Mire, NP for further evaluation of GERD and abdominal pain with diarrhea.  The patient states that he has had problems with abdominal pain and diarrhea for a long time.  His symptoms were much worse in the past, and he said there appears when he would wake up every day between 4 and 5 in the morning with abdominal pain and urge to defecate.  He would have numerous loose bowel movements with urgency throughout the day associated with significant crampy abdominal pain.  He was also having significant heartburn and acid regurgitation.  In the past few months, he has made multiple dietary changes which have significantly improved his GI symptoms.  Specifically, he now avoids spicy foods, coffee, fried foods, soda, sauces and citrus, chocolate Currently, he has about 5 bowel movements a day, with mostly formed stool, minimal urgency.  His abdominal pain is mild and infrequent.  He states that he does see some evidence of blood in the stool on occasion.  The blood is always scant in quantity and subtle.  He does not see it frequently. Whereas he used to have very frequent acid regurgitation, now it is only happening once or twice a week, and he takes omeprazole most days of the week, but not every day.  His weight has been stable. His mother was diagnosed with colon cancer in her mid 55s.  There is no other family history of colon cancer that the patient is aware of.     Past Medical History:  Diagnosis Date   Hypertension      Past Surgical History:  Procedure Laterality Date   testicular torsion Right    Family History  Problem Relation Age of Onset   Colon cancer Mother    Pancreatic cancer Neg Hx    Esophageal cancer Neg Hx    Liver cancer Neg Hx    Stomach cancer Neg Hx    Social History   Tobacco Use   Smoking status: Former    Types: Cigarettes    Quit date: 03/27/2009    Years since quitting: 12.2    Smokeless tobacco: Never  Substance Use Topics   Alcohol use: Yes    Comment: 6 pack a week   Drug use: Yes    Types: Marijuana    Comment: once a day   Current Outpatient Medications  Medication Sig Dispense Refill   fluticasone (FLONASE) 50 MCG/ACT nasal spray Place 2 sprays into both nostrils daily. 16 g 6   omeprazole (PRILOSEC) 20 MG capsule Take 1 capsule (20 mg total) by mouth daily. 30 capsule 3   No current facility-administered medications for this visit.   Allergies  Allergen Reactions   Penicillins Hives and Rash     Review of Systems: All systems reviewed and negative except where noted in HPI.    No results found.  Physical Exam: BP 120/70   Pulse 72   Ht 5\' 11"  (1.803 m)   Wt 233 lb (105.7 kg)   SpO2 97%   BMI 32.50 kg/m  Constitutional: Pleasant,well-developed, Caucasian male in no acute distress. HEENT: Normocephalic and atraumatic. Conjunctivae are normal. No scleral icterus.  Mallampati 1 Cardiovascular: Normal rate, regular rhythm.  Pulmonary/chest: Effort normal and breath sounds normal. No wheezing, rales or rhonchi. Abdominal: Soft, nondistended, nontender. Bowel sounds active throughout. There are no masses palpable. No hepatomegaly. Extremities: no edema Neurological: Alert and oriented to person place and  time. Skin: Skin is warm and dry. No rashes noted. Psychiatric: Normal mood and affect. Behavior is normal.  CBC    Component Value Date/Time   WBC 15.0 (H) 05/14/2020 1058   RBC 5.23 05/14/2020 1058   HGB 15.3 05/14/2020 1058   HGB 16.4 02/14/2017 1029   HCT 46.6 05/14/2020 1058   HCT 47.7 02/14/2017 1029   PLT 239 05/14/2020 1058   PLT 235 02/14/2017 1029   MCV 89.1 05/14/2020 1058   MCV 89 02/14/2017 1029   MCH 29.3 05/14/2020 1058   MCHC 32.8 05/14/2020 1058   RDW 12.7 05/14/2020 1058   RDW 13.0 02/14/2017 1029   LYMPHSABS 2.1 05/14/2020 1058   LYMPHSABS 2.0 02/14/2017 1029   MONOABS 1.2 (H) 05/14/2020 1058   EOSABS 0.0  05/14/2020 1058   EOSABS 0.1 02/14/2017 1029   BASOSABS 0.0 05/14/2020 1058   BASOSABS 0.0 02/14/2017 1029    CMP     Component Value Date/Time   NA 141 03/14/2021 0935   K 4.5 03/14/2021 0935   CL 104 03/14/2021 0935   CO2 21 03/14/2021 0935   GLUCOSE 120 (H) 03/14/2021 0935   GLUCOSE 123 (H) 05/14/2020 1058   BUN 12 03/14/2021 0935   CREATININE 0.97 03/14/2021 0935   CALCIUM 9.5 03/14/2021 0935   PROT 7.7 03/14/2021 0935   ALBUMIN 4.7 03/14/2021 0935   AST 28 03/14/2021 0935   ALT 34 03/14/2021 0935   ALKPHOS 69 03/14/2021 0935   BILITOT 0.5 03/14/2021 0935   GFRNONAA >60 05/14/2020 1058   GFRAA >60 05/14/2020 1058     ASSESSMENT AND PLAN: 35 year old male with chronic symptoms of abdominal pain and diarrhea as well as typical GERD symptoms, with significant provement in all the symptoms with dietary modification and PPI.  He has seen some evidence of scant blood in the stool on occasion.  He also has a family history of colon cancer (mother, 23s).  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  We discussed the proposed pathophysiology of IBS and gut brain axis disorders in general.  We discussed management of IBS, to include use of medications to improve bowel habits, as needed pain medicine, centrally acting neuromodulators, role of empiric dietary modifications to include a low FODMAP diet gluten-free diet, as well as the role of cognitive therapies.  We discussed the goals of IBS management, namely to minimize the impact of GI symptoms on quality of life.  I emphasized that it would be unreasonable for the patient to expect completely normal bowel habits and no abdominal discomfort.  The patient is currently satisfied with his symptom control with diet alone, and we agreed not to start any new medications at this time. Because of his hematochezia and his family history of colon cancer, a colonoscopy is indicated.  Although unlikely, we will also  exclude inflammatory bowel disease and microscopic colitis as etiologies for his diarrhea.  We will screen for celiac disease and H. Pylori. For his GERD, he can continue his dietary modifications and his omeprazole.   Hematochezia - Colonoscopy  IBS-D - Continue dietary modifications as above - Rule out celiac and H. pylori - Colonoscopy to rule out IBD and microscopic colitis  Fam hx CRC -Patient will need colonoscopy every 5 years after this one -Patient advised to recommend his brother to [place] start screening colonoscopies at age 65, sooner if he has any concerning symptoms  GERD -Continue omeprazole and dietary modifications  CC:  Kerin Perna,  NP

## 2021-06-29 NOTE — Patient Instructions (Signed)
If you are age 34 or older, your body mass index should be between 23-30. Your Body mass index is 32.5 kg/m. If this is out of the aforementioned range listed, please consider follow up with your Primary Care Provider.  If you are age 59 or younger, your body mass index should be between 19-25. Your Body mass index is 32.5 kg/m. If this is out of the aformentioned range listed, please consider follow up with your Primary Care Provider.   You have been scheduled for a colonoscopy. Please follow written instructions given to you at your visit today.  Please pick up your prep supplies at the pharmacy within the next 1-3 days. If you use inhalers (even only as needed), please bring them with you on the day of your procedure.   Your provider has requested that you go to the basement level for lab work before leaving today. Press "B" on the elevator. The lab is located at the first door on the left as you exit the elevator.  Due to recent changes in healthcare laws, you may see the results of your imaging and laboratory studies on MyChart before your provider has had a chance to review them.  We understand that in some cases there may be results that are confusing or concerning to you. Not all laboratory results come back in the same time frame and the provider may be waiting for multiple results in order to interpret others.  Please give [place] 48 hours in order for your provider to thoroughly review all the results before contacting the office for clarification of your results.   The [facility] providers would like to encourage you to use [facility] to communicate with providers for non-urgent requests or questions.  Due to long hold times on the telephone, sending your provider a message by [facility] may be a faster and more efficient way to get a response.  Please allow 48 business hours for a response.  Please remember that this is for non-urgent requests.   It was a pleasure to see you today!  Thank you for  trusting me with your gastrointestinal care!    Scott E. Candis Schatz, MD

## 2021-07-02 LAB — IGA: Immunoglobulin A: 304 mg/dL (ref 47–310)

## 2021-07-02 LAB — TISSUE TRANSGLUTAMINASE, IGA: (tTG) Ab, IgA: <1 U/mL

## 2021-07-03 ENCOUNTER — Other Ambulatory Visit: Payer: 59

## 2021-07-03 DIAGNOSIS — K921 Melena: Secondary | ICD-10-CM

## 2021-07-03 DIAGNOSIS — Z800 Family history of malignant neoplasm of digestive organs: Secondary | ICD-10-CM

## 2021-07-03 DIAGNOSIS — K580 Irritable bowel syndrome with diarrhea: Secondary | ICD-10-CM

## 2021-07-04 NOTE — Progress Notes (Signed)
Tyler Krause,  The blood tests suggest you do not have celiac disease which is a potential cause of chronic abdominal pain and diarrhea.  This is good news.  A stool test was ordered to check for a bacteria that can cause chronic abdominal pain.  Please submit this at your convenience.

## 2021-07-05 LAB — H. PYLORI ANTIGEN, STOOL: H pylori Ag, Stl: NEGATIVE

## 2021-07-11 NOTE — Progress Notes (Signed)
Tyler Krause,  Your stool test for H. Pylori (a common bacteria that lives in the stomach and can cause chronic abdominal pain) was negative.  Please proceed with your colonoscopy as planned.

## 2021-08-13 ENCOUNTER — Other Ambulatory Visit: Payer: Self-pay

## 2021-08-13 ENCOUNTER — Telehealth: Payer: Self-pay | Admitting: Gastroenterology

## 2021-08-13 MED ORDER — SUTAB 1479-225-188 MG PO TABS: 1.0000 | ORAL_TABLET | Freq: Once | ORAL | 0 refills | Status: AC

## 2021-08-13 NOTE — Telephone Encounter (Signed)
Sent Sutab to [facility] on [address] per patients request.

## 2021-08-13 NOTE — Telephone Encounter (Signed)
Inbound call from patient have procedure 11/15 States he never received prep for sutab. States [facility] on [address] is the only pharmacy that have it available to pick up to start tonight

## 2021-08-14 ENCOUNTER — Other Ambulatory Visit: Payer: Self-pay

## 2021-08-14 ENCOUNTER — Ambulatory Visit (AMBULATORY_SURGERY_CENTER): Payer: 59 | Admitting: Gastroenterology

## 2021-08-14 ENCOUNTER — Encounter: Payer: Self-pay | Admitting: Gastroenterology

## 2021-08-14 VITALS — BP 124/62 | HR 66 | Temp 98.4°F | Resp 8 | Ht 71.0 in | Wt 233.0 lb

## 2021-08-14 DIAGNOSIS — K921 Melena: Secondary | ICD-10-CM

## 2021-08-14 DIAGNOSIS — D120 Benign neoplasm of cecum: Secondary | ICD-10-CM | POA: Diagnosis not present

## 2021-08-14 DIAGNOSIS — D122 Benign neoplasm of ascending colon: Secondary | ICD-10-CM

## 2021-08-14 MED ORDER — SODIUM CHLORIDE 0.9 % IV SOLN
500.0000 mL | Freq: Once | INTRAVENOUS | Status: DC
Start: 2021-08-14 — End: 2021-08-14

## 2021-08-14 NOTE — Progress Notes (Signed)
Report to PACU, RN, vss, BBS= Clear.  

## 2021-08-14 NOTE — Patient Instructions (Signed)
Impressions/Recommendations:  Polyp handout given to patient.  Resume previous diet. Continue present medications. Await pathology results.  Repeat colonscopy at age 35.  Recommend daily fiber supplement.  YOU HAD AN ENDOSCOPIC PROCEDURE TODAY AT [facility]:   Refer to the procedure report that was given to you for any specific questions about what was found during the examination.  If the procedure report does not answer your questions, please call your gastroenterologist to clarify.  If you requested that your care partner not be given the details of your procedure findings, then the procedure report has been included in a sealed envelope for you to review at your convenience later.  YOU SHOULD EXPECT: Some feelings of bloating in the abdomen. Passage of more gas than usual.  Walking can help get rid of the air that was put into your GI tract during the procedure and reduce the bloating. If you had a lower endoscopy (such as a colonoscopy or flexible sigmoidoscopy) you may notice spotting of blood in your stool or on the toilet paper. If you underwent a bowel prep for your procedure, you may not have a normal bowel movement for a few days.  Please Note:  You might notice some irritation and congestion in your nose or some drainage.  This is from the oxygen used during your procedure.  There is no need for concern and it should clear up in a day or so.  SYMPTOMS TO REPORT IMMEDIATELY:  Following lower endoscopy (colonoscopy or flexible sigmoidoscopy):  Excessive amounts of blood in the stool  Significant tenderness or worsening of abdominal pains  Swelling of the abdomen that is new, acute  Fever of 100F or higher For urgent or emergent issues, a gastroenterologist can be reached at any hour by calling [phone]. Do not use MyChart messaging for urgent concerns.    DIET:  We do recommend a small meal at first, but then you may proceed to your regular diet.  Drink  plenty of fluids but you should avoid alcoholic beverages for 24 hours.  ACTIVITY:  You should plan to take it easy for the rest of today and you should NOT DRIVE or use heavy machinery until tomorrow (because of the sedation medicines used during the test).    FOLLOW UP: Our staff will call the number listed on your records 48-72 hours following your procedure to check on you and address any questions or concerns that you may have regarding the information given to you following your procedure. If we do not reach you, we will leave a message.  We will attempt to reach you two times.  During this call, we will ask if you have developed any symptoms of COVID 19. If you develop any symptoms (ie: fever, flu-like symptoms, shortness of breath, cough etc.) before then, please call [phone].  If you test positive for Covid 19 in the 2 weeks post procedure, please call and report this information to [place].    If any biopsies were taken you will be contacted by phone or by letter within the next 1-3 weeks.  Please call us at [phone] if you have not heard about the biopsies in 3 weeks.    SIGNATURES/CONFIDENTIALITY: You and/or your care partner have signed paperwork which will be entered into your electronic medical record.  These signatures attest to the fact that that the information above on your After Visit Summary has been reviewed and is understood.  Full responsibility of the confidentiality of this  discharge information lies with you and/or your care-partner.

## 2021-08-14 NOTE — Op Note (Signed)
Tyler Krause Patient Name: Tyler Krause Procedure Date: 08/14/2021 2:01 PM MRN: [id number] Endoscopist: Nicki Reaper E. Candis Schatz , MD Age: 35 Referring MD:  Date of Birth: 09-04-1986 Gender: Male Account #: [id number] Procedure:                Colonoscopy Indications:              Hematochezia Medicines:                Monitored Anesthesia Care Procedure:                Pre-Anesthesia Assessment:                           - Prior to the procedure, a History and Physical                            was performed, and patient medications and                            allergies were reviewed. The patient's tolerance of                            previous anesthesia was also reviewed. The risks                            and benefits of the procedure and the sedation                            options and risks were discussed with the patient.                            All questions were answered, and informed consent                            was obtained. Prior Anticoagulants: The patient has                            taken no previous anticoagulant or antiplatelet                            agents. ASA Grade Assessment: II - A patient with                            mild systemic disease. After reviewing the risks                            and benefits, the patient was deemed in                            satisfactory condition to undergo the procedure.                           After obtaining informed consent, the colonoscope  was passed under direct vision. Throughout the                            procedure, the patient's blood pressure, pulse, and                            oxygen saturations were monitored continuously. The                            #[id number] Olympus CF-[id number] was introduced through                            the anus and advanced to the the terminal ileum,                            with identification of the appendiceal  orifice and                            IC valve. The colonoscopy was performed without                            difficulty. The patient tolerated the procedure                            well. The quality of the bowel preparation was                            adequate. The terminal ileum, ileocecal valve,                            appendiceal orifice, and rectum were photographed.                            The bowel preparation used was Sutab via split dose                            instruction. Scope In: 2:15:22 PM Scope Out: 2:35:14 PM Scope Withdrawal Time: 0 hours 16 minutes 58 seconds  Total Procedure Duration: 0 hours 19 minutes 52 seconds  Findings:                 The perianal and digital rectal examinations were                            normal. Pertinent negatives include normal                            sphincter tone and no palpable rectal lesions.                           A 3 mm polyp was found in the cecum. The polyp was                            sessile. The polyp was removed with a  hot snare.                            Resection and retrieval were complete. Estimated                            blood loss was minimal.                           A 6 mm polyp was found in the ascending colon. The                            polyp was sessile. The polyp was removed with a                            cold snare. Resection and retrieval were complete.                            Estimated blood loss was minimal.                           The exam was otherwise normal throughout the                            examined colon.                           The terminal ileum appeared normal.                           The retroflexed view of the distal rectum and anal                            verge was normal and showed no anal or rectal                            abnormalities. Complications:            No immediate complications. Estimated Blood Loss:     Estimated blood  loss was minimal. Impression:               - One 3 mm polyp in the cecum, removed with a hot                            snare. Resected and retrieved.                           - One 6 mm polyp in the ascending colon, removed                            with a cold snare. Resected and retrieved.                           - The examined portion of the ileum was normal.                           -  The distal rectum and anal verge are normal on                            retroflexion view.                           - The patient's hematochezia can be attributed to                            internal hemorrhoids, given the absence of any                            other pathology. Recommendation:           - Patient has a contact number available for                            emergencies. The signs and symptoms of potential                            delayed complications were discussed with the                            patient. Return to normal activities tomorrow.                            Written discharge instructions were provided to the                            patient.                           - Resume previous diet.                           - Continue present medications.                           - Await pathology results.                           - Repeat colonoscopy at age 78, then every 5 years.                           - Recommend daily fiber supplementation. Tyler Krause E. Candis Schatz, MD 08/14/2021 2:42:31 PM This report has been signed electronically.

## 2021-08-14 NOTE — Progress Notes (Signed)
Called to room to assist during endoscopic procedure.  Patient ID and intended procedure confirmed with present staff. Received instructions for my participation in the procedure from the performing physician.  

## 2021-08-14 NOTE — Progress Notes (Signed)
[facility] History and Physical   Primary Care Physician:  Kerin Perna, NP   Reason for Procedure:   Hematochezia, family history of colon cancer  Plan:    Colonoscopy     HPI: Tyler Krause is a 35 y.o. male undergoing colonoscopy due to a history of recurrent hematochezia and a family history of colon cancer (mother, 77s).  He also has chronic loose stools.  His symptoms have been improved since his clinic visit at the end of September.  Past Medical History:  Diagnosis Date   Allergy    Hypertension     Past Surgical History:  Procedure Laterality Date   testicular torsion Right     Prior to Admission medications   Medication Sig Start Date End Date Taking? Authorizing Provider  fluticasone (FLONASE) 50 MCG/ACT nasal spray Place 2 sprays into both nostrils daily. 08/21/20  Yes Kerin Perna, NP  omeprazole (PRILOSEC) 20 MG capsule Take 1 capsule (20 mg total) by mouth daily. 03/07/21  Yes Kerin Perna, NP    Current Outpatient Medications  Medication Sig Dispense Refill   fluticasone (FLONASE) 50 MCG/ACT nasal spray Place 2 sprays into both nostrils daily. 16 g 6   omeprazole (PRILOSEC) 20 MG capsule Take 1 capsule (20 mg total) by mouth daily. 30 capsule 3   Current Facility-Administered Medications  Medication Dose Route Frequency Provider Last Rate Last Admin   0.9 %  sodium chloride infusion  500 mL Intravenous Once Daryel November, MD        Allergies as of 08/14/2021 - Review Complete 08/14/2021  Allergen Reaction Noted   Penicillins Hives and Rash     Family History  Problem Relation Age of Onset   Colon cancer Mother    Pancreatic cancer Neg Hx    Esophageal cancer Neg Hx    Liver cancer Neg Hx    Stomach cancer Neg Hx     Social History   Socioeconomic History   Marital status: Single    Spouse name: Not on file   Number of children: Not on file   Years of education: Not on file   Highest education  level: Not on file  Occupational History   Not on file  Tobacco Use   Smoking status: Former    Types: Cigarettes    Quit date: 03/27/2009    Years since quitting: 12.3   Smokeless tobacco: Never  Substance and Sexual Activity   Alcohol use: Yes    Comment: 6 pack a week   Drug use: Yes    Types: Marijuana    Comment: once a day   Sexual activity: Not on file  Other Topics Concern   Not on file  Social History Narrative   Not on file   Social Determinants of Health   Financial Resource Strain: Not on file  Food Insecurity: Not on file  Transportation Needs: Not on file  Physical Activity: Not on file  Stress: Not on file  Social Connections: Not on file  Intimate Partner Violence: Not on file    Review of Systems:  All other review of systems negative except as mentioned in the HPI.  Physical Exam: Vital signs BP 137/77   Pulse 80   Temp 98.4 F (36.9 C) (Skin)   Ht 5\' 11"  (1.803 m)   Wt 233 lb (105.7 kg)   SpO2 98%   BMI 32.50 kg/m   General:   Alert,  Well-developed, well-nourished, pleasant and cooperative in  NAD Airway:  Mallampati 2 Lungs:  Clear throughout to auscultation.   Heart:  Regular rate and rhythm; no murmurs, clicks, rubs,  or gallops. Abdomen:  Soft, nontender and nondistended. Normal bowel sounds.   Neuro/Psych:  Normal mood and affect. A and O x 3   Marcia Lepera E. Candis Schatz, MD [facility]

## 2021-08-14 NOTE — Progress Notes (Signed)
Pt's states no medical or surgical changes since previsit or office visit. VS assessed by D.T 

## 2021-08-16 ENCOUNTER — Telehealth: Payer: Self-pay

## 2021-08-16 NOTE — Telephone Encounter (Signed)
  Follow up Call-  Call back number 08/14/2021  Post procedure Call Back phone  # [phone]  Permission to leave phone message Yes  Some recent data might be hidden     Attempted to leave a message but mailbox has not been set up

## 2021-08-16 NOTE — Telephone Encounter (Signed)
No answer, unable to leave a message, B.Dianca Owensby RN. 

## 2021-08-18 NOTE — Progress Notes (Signed)
Tyler Krause, The two polyps which I removed during your recent procedure were proven to be completely benign but are considered "pre-cancerous" polyps that MAY have grown into cancer if they had not been removed.  Studies shows that at least 20% of women over age 34 and 30% of men over age 15 have pre-cancerous polyps.  Based on current nationally recognized surveillance guidelines, I recommend that you have a repeat colonoscopy at age 30 (26 years from now), and then every 5 years (due to your family history of colon cancer).   If you develop any new rectal bleeding, abdominal pain or significant bowel habit changes, please contact me before then.

## 2021-09-17 ENCOUNTER — Encounter: Payer: Self-pay | Admitting: Family Medicine

## 2021-09-17 ENCOUNTER — Telehealth: Payer: 59 | Admitting: Family Medicine

## 2021-09-17 DIAGNOSIS — R6889 Other general symptoms and signs: Secondary | ICD-10-CM

## 2021-09-17 DIAGNOSIS — R051 Acute cough: Secondary | ICD-10-CM

## 2021-09-17 MED ORDER — FLUTICASONE PROPIONATE 50 MCG/ACT NA SUSP: 2.0000 | Freq: Every day | NASAL | 0 refills | Status: DC

## 2021-09-17 MED ORDER — BENZONATATE 100 MG PO CAPS: 100.0000 mg | ORAL_CAPSULE | Freq: Two times a day (BID) | ORAL | 0 refills | Status: DC | PRN

## 2021-09-17 NOTE — Patient Instructions (Addendum)
I appreciate the opportunity to provide you with care for your health and wellness.  Take medication as directed  Happy Holidays   Please continue to practice social distancing to keep you, your family, and our community safe.  If you must go out, please wear a mask and practice good handwashing.  Have a wonderful day. With Gratitude, Cherly Beach, DNP, AGNP-BC

## 2021-09-17 NOTE — Progress Notes (Signed)
Virtual Visit Consent   Tyler Krause, you are scheduled for a virtual visit with a [facility] provider today.     Just as with appointments in the office, your consent must be obtained to participate.  Your consent will be active for this visit and any virtual visit you may have with one of our providers in the next 365 days.     If you have a MyChart account, a copy of this consent can be sent to you electronically.  All virtual visits are billed to your insurance company just like a traditional visit in the office.    As this is a virtual visit, video technology does not allow for your provider to perform a traditional examination.  This may limit your provider's ability to fully assess your condition.  If your provider identifies any concerns that need to be evaluated in person or the need to arrange testing (such as labs, EKG, etc.), we will make arrangements to do so.     Although advances in technology are sophisticated, we cannot ensure that it will always work on either your end or our end.  If the connection with a video visit is poor, the visit may have to be switched to a telephone visit.  With either a video or telephone visit, we are not always able to ensure that we have a secure connection.     I need to obtain your verbal consent now.   Are you willing to proceed with your visit today?    Tyler Krause has provided verbal consent on 09/17/2021 for a virtual visit (video or telephone).   Perlie Mayo, NP   Date: 09/17/2021 12:20 PM   Virtual Visit via Video Note   I, Perlie Mayo, connected with  Tyler Krause  ([id number], 1985/11/14) on 09/17/21 at 12:30 PM EST by a video-enabled telemedicine application and verified that I am speaking with the correct person using two identifiers.  Location: Patient: Virtual Visit Location Patient: Home Provider: Virtual Visit Location Provider: Home Office   I discussed the limitations of evaluation and management by  telemedicine and the availability of in person appointments. The patient expressed understanding and agreed to proceed.    History of Present Illness: Tyler Krause is a 35 y.o. who identifies as a male who was assigned male at birth, and is being seen today for coughing started last night. Both children have been sick. No Dx of covid, flu, rsv, or strep - in kids. COVID neg this am.    HPI: Influenza This is a new problem. The current episode started yesterday. The problem occurs constantly. The problem has been rapidly worsening. Associated symptoms include arthralgias, chills, congestion, coughing, fatigue, a fever, headaches, myalgias and vertigo. Pertinent negatives include no anorexia, nausea, sore throat or vomiting. The symptoms are aggravated by coughing. Treatments tried: mucinex. The treatment provided mild relief.   Problems:  Patient Active Problem List   Diagnosis Date Noted   Acute diarrhea 06/23/2015   OBESITY, NOS 11/27/2006   Alcohol abuse 11/27/2006   TOBACCO DEPENDENCE 11/27/2006   ATTENTION DEFICIT, W/HYPERACTIVITY 11/27/2006   HYPERTENSION, BENIGN SYSTEMIC 11/27/2006    Allergies:  Allergies  Allergen Reactions   Penicillins Hives and Rash   Medications:  Current Outpatient Medications:    fluticasone (FLONASE) 50 MCG/ACT nasal spray, Place 2 sprays into both nostrils daily., Disp: 16 g, Rfl: 6   omeprazole (PRILOSEC) 20 MG capsule, Take 1 capsule (20 mg total) by  mouth daily., Disp: 30 capsule, Rfl: 3  Observations/Objective: Patient is well-developed, well-nourished in no acute distress.  Resting comfortably  at home.  Head is normocephalic, atraumatic.  No labored breathing.  Speech is clear and coherent with logical content.  Patient is alert and oriented at baseline.    Assessment and Plan: 1. Flu-like symptoms  - benzonatate (TESSALON) 100 MG capsule; Take 1 capsule (100 mg total) by mouth 2 (two) times daily as needed for cough.  Dispense: 20  capsule; Refill: 0 - fluticasone (FLONASE) 50 MCG/ACT nasal spray; Place 2 sprays into both nostrils daily.  Dispense: 16 g; Refill: 0  2. Acute cough  - benzonatate (TESSALON) 100 MG capsule; Take 1 capsule (100 mg total) by mouth 2 (two) times daily as needed for cough.  Dispense: 20 capsule; Refill: 0  Unsure of Flu, S&S consistent with this. No antiviral desired, needs work note. Cough and congestion treatment as above. OTC reviewed   Reviewed side effects, risks and benefits of medication.    Patient acknowledged agreement and understanding of the plan.   I discussed the assessment and treatment plan with the patient. The patient was provided an opportunity to ask questions and all were answered. The patient agreed with the plan and demonstrated an understanding of the instructions.   The patient was advised to call back or seek an in-person evaluation if the symptoms worsen or if the condition fails to improve as anticipated.   The above assessment and management plan was discussed with the patient. The patient verbalized understanding of and has agreed to the management plan. Patient is aware to call the clinic if symptoms persist or worsen. Patient is aware when to return to the clinic for a follow-up visit. Patient educated on when it is appropriate to go to the emergency department.     Follow Up Instructions: I discussed the assessment and treatment plan with the patient. The patient was provided an opportunity to ask questions and all were answered. The patient agreed with the plan and demonstrated an understanding of the instructions.  A copy of instructions were sent to the patient via MyChart unless otherwise noted below.    The patient was advised to call back or seek an in-person evaluation if the symptoms worsen or if the condition fails to improve as anticipated.  Time:  I spent 10 minutes with the patient via telehealth technology discussing the above  problems/concerns.    Perlie Mayo, NP

## 2021-09-19 ENCOUNTER — Telehealth: Payer: Medicaid Other | Admitting: Family

## 2021-09-19 DIAGNOSIS — R6889 Other general symptoms and signs: Secondary | ICD-10-CM

## 2021-09-19 NOTE — Progress Notes (Signed)
E visit for Flu like symptoms   We are sorry that you are not feeling well.  Here is how we plan to help! Based on what you have shared with me it looks like you may have a respiratory virus that may be influenza.  Influenza or the flu is   an infection caused by a respiratory virus. The flu virus is highly contagious and persons who did not receive their yearly flu vaccination may catch the flu from close contact.  We have anti-viral medications to treat the viruses that cause this infection. They are not a cure and only shorten the course of the infection. These prescriptions are most effective when they are given within the first 2 days of flu symptoms. Antiviral medication are indicated if you have a high risk of complications from the flu. You should  also consider an antiviral medication if you are in close contact with someone who is at risk. These medications can help patients avoid complications from the flu  but have side effects that you should know. Possible side effects from Tamiflu or oseltamivir include nausea, vomiting, diarrhea, dizziness, headaches, eye redness, sleep problems or other respiratory symptoms. You should not take Tamiflu if you have an allergy to oseltamivir or any to the ingredients in Tamiflu.  I have sent a work note to Doctor, hospital.   ANYONE WHO HAS FLU SYMPTOMS SHOULD: Stay home. The flu is highly contagious and going out or to work exposes others! Be sure to drink plenty of fluids. Water is fine as well as fruit juices, sodas and electrolyte beverages. You may want to stay away from caffeine or alcohol. If you are nauseated, try taking small sips of liquids. How do you know if you are getting enough fluid? Your urine should be a pale yellow or almost colorless. Get rest. Taking a steamy shower or using a humidifier may help nasal congestion and ease sore throat pain. Using a saline nasal spray works much the same way. Cough drops, hard candies and sore  throat lozenges may ease your cough. Line up a caregiver. Have someone check on you regularly.   GET HELP RIGHT AWAY IF: You cannot keep down liquids or your medications. You become short of breath Your fell like you are going to pass out or loose consciousness. Your symptoms persist after you have completed your treatment plan MAKE SURE YOU  Understand these instructions. Will watch your condition. Will get help right away if you are not doing well or get worse.  Your e-visit answers were reviewed by a board certified advanced clinical practitioner to complete your personal care plan.  Depending on the condition, your plan could have included both over the counter or prescription medications.  If there is a problem please reply  once you have received a response from your provider.  Your safety is important to [place].  If you have drug allergies check your prescription carefully.    You can use MyChart to ask questions about todays visit, request a non-urgent call back, or ask for a work or school excuse for 24 hours related to this e-Visit. If it has been greater than 24 hours you will need to follow up with your provider, or enter a new e-Visit to address those concerns.  You will get an e-mail in the next two days asking about your experience.  I hope that your e-visit has been valuable and will speed your recovery. Thank you for using e-visits.  Approximately 5 minutes  was spent documenting and reviewing patient's chart.

## 2021-09-19 NOTE — Progress Notes (Signed)
Hello, Did you mean you put that you are having shortness of breath and chest pain? It seems from extra you typed you are having mild symptoms at this point and need a work note. I am just verifying since you clicked yes to those questions.    Evelina Dun, FNP  Approximately 5 minutes was spent documenting and reviewing patient's chart.

## 2021-10-19 ENCOUNTER — Other Ambulatory Visit (INDEPENDENT_AMBULATORY_CARE_PROVIDER_SITE_OTHER): Payer: Self-pay | Admitting: Primary Care

## 2021-10-19 DIAGNOSIS — K219 Gastro-esophageal reflux disease without esophagitis: Secondary | ICD-10-CM

## 2021-10-19 NOTE — Telephone Encounter (Signed)
Routed to PCP 

## 2021-12-12 ENCOUNTER — Telehealth: Payer: 59 | Admitting: Physician Assistant

## 2021-12-12 DIAGNOSIS — R197 Diarrhea, unspecified: Secondary | ICD-10-CM

## 2021-12-12 MED ORDER — DICYCLOMINE HCL 10 MG PO CAPS: 10.0000 mg | ORAL_CAPSULE | Freq: Three times a day (TID) | ORAL | 0 refills | Status: DC

## 2021-12-12 NOTE — Patient Instructions (Signed)
?Tyler Krause, thank you for joining Rodney Booze, PA-C for today's virtual visit.  While this provider is not your primary care provider (PCP), if your PCP is located in our provider database this encounter information will be shared with them immediately following your visit. ? ?Consent: ?(Patient) Tyler Krause provided verbal consent for this virtual visit at the beginning of the encounter. ? ?Current Medications: ? ?Current Outpatient Medications:  ?  dicyclomine (BENTYL) 10 MG capsule, Take 1 capsule (10 mg total) by mouth 4 (four) times daily -  before meals and at bedtime., Disp: 20 capsule, Rfl: 0 ?  benzonatate (TESSALON) 100 MG capsule, Take 1 capsule (100 mg total) by mouth 2 (two) times daily as needed for cough., Disp: 20 capsule, Rfl: 0 ?  fluticasone (FLONASE) 50 MCG/ACT nasal spray, Place 2 sprays into both nostrils daily., Disp: 16 g, Rfl: 0 ?  omeprazole (PRILOSEC) 20 MG capsule, TAKE 1 CAPSULE(20 MG) BY MOUTH DAILY, Disp: 30 capsule, Rfl: 3  ? ?Medications ordered in this encounter:  ?Meds ordered this encounter  ?Medications  ? dicyclomine (BENTYL) 10 MG capsule  ?  Sig: Take 1 capsule (10 mg total) by mouth 4 (four) times daily -  before meals and at bedtime.  ?  Dispense:  20 capsule  ?  Refill:  0  ?  Order Specific Question:   Supervising Provider  ?  Answer:   Noemi Chapel [[id number]]  ?  ? ?*If you need refills on other medications prior to your next appointment, please contact your pharmacy* ? ?Follow-Up: ?Call back or seek an in-person evaluation if the symptoms worsen or if the condition fails to improve as anticipated. ? ?Other Instructions ?Stick to the [employer] (bananas, rice, applesauce and toast). Take bentyl as prescribed.  ? ?Please follow up with your primary doctor within the next 5-7 days.  If you do not have a primary care provider, information for a healthcare clinic has been provided for you to make arrangements for follow up care. Please seek care in the ER sooner  if you have any new or worsening symptoms, or if you have any of the following symptoms: ? ?Abdominal pain that does not go away.  ?You have a fever.  ?You keep throwing up (vomiting).  ?The pain is felt only in portions of the abdomen. Pain in the right side could possibly be appendicitis. In an adult, pain in the left lower portion of the abdomen could be colitis or diverticulitis.  ?You pass bloody or black tarry stools.  ?There is bright red blood in the stool.  ?The constipation stays for more than 4 days.  ?There is belly (abdominal) or rectal pain.  ?You do not seem to be getting better.  ?You have any questions or concerns.  ? ? ? ?If you have been instructed to have an in-person evaluation today at a local Urgent Care facility, please use the link below. It will take you to a list of all of our available [facility], including address, phone number and hours of operation. Please do not delay care.  ?[facility] ? ?If you or a family member do not have a primary care provider, use the link below to schedule a visit and establish care. When you choose a [facility] primary care physician or advanced practice provider, you gain a long-term partner in health. ?Find a Primary Care Provider ? ?Learn more about [facility]'s in-office and virtual care options: ?  [facility] ? ?

## 2021-12-12 NOTE — Progress Notes (Signed)
Mr. krishna, dancel are scheduled for a virtual visit with your provider today.   ? ?Just as we do with appointments in the office, we must obtain your consent to participate.  Your consent will be active for this visit and any virtual visit you may have with one of our providers in the next 365 days.   ? ?If you have a MyChart account, I can also send a copy of this consent to you electronically.  All virtual visits are billed to your insurance company just like a traditional visit in the office.  As this is a virtual visit, video technology does not allow for your provider to perform a traditional examination.  This may limit your provider's ability to fully assess your condition.  If your provider identifies any concerns that need to be evaluated in person or the need to arrange testing such as labs, EKG, etc, we will make arrangements to do so.   ? ?Although advances in technology are sophisticated, we cannot ensure that it will always work on either your end or our end.  If the connection with a video visit is poor, we may have to switch to a telephone visit.  With either a video or telephone visit, we are not always able to ensure that we have a secure connection.   I need to obtain your verbal consent now.   Are you willing to proceed with your visit today?  ? ?ZYRION COEY has provided verbal consent on 12/12/2021 for a virtual visit (video or telephone). ? ? ?[place], PA-C ?12/12/2021  11:43 AM ? ? ?Date:  12/12/2021  ? ?ID:  BLAYDE BACIGALUPI, DOB 1986/01/09, MRN [id number] ? ?Patient Location: Home ?Provider Location: Home Office ? ? ?Participants: Patient and Provider for Visit and Wrap up ? ?Method of visit: Video  ?Location of Patient: Home ?Location of Provider: Home Office ?Consent was obtain for visit over the video. ?Services rendered by provider: Visit was performed via video ? ?A video enabled telemedicine application was used and I verified that I am speaking with the correct person using two  identifiers. ? ?PCP:  Kerin Perna, NP  ? ?Chief Complaint:  work note ? ?History of Present Illness:   ? ?Tyler Krause is a 36 y.o. male with history as stated below. ?Presents video telehealth for an acute care visit ? ?Pt states that since yesterday he started having fatigue, diarrhea, abd bloating and cramping. He has some improvement of symptoms today but are not completely resolved. Abd discomfort is improved. Reports nausea but no vomiting. He reports some sweats, chills, but no documented fevers. Denies bloody stools or melena. Denies recent abx use or foreign travel. He recently ate some chinese foot that smelled off so he is not sure if this contributed to his symptoms. Reports his son has similar sxs. ? ?Past Medical, Surgical, Social History, Allergies, and Medications have been Reviewed. ? ?Past Medical History:  ?Diagnosis Date  ? Allergy   ? Hypertension   ? ? ?Current Meds  ?Medication Sig  ? dicyclomine (BENTYL) 10 MG capsule Take 1 capsule (10 mg total) by mouth 4 (four) times daily -  before meals and at bedtime.  ?  ? ?Allergies:   Penicillins  ? ?ROS ?See HPI for history of present illness. ? ?Physical Exam ?Constitutional:   ?   Appearance: Normal appearance.  ?Pulmonary:  ?   Effort: Pulmonary effort is normal.  ?Neurological:  ?   Mental Status: He  is alert.  ? ?    ?MDM: diarrhea starting yesterday. Sick contact with similar. Suspect viral illness. Bentyl sent. Brat diet recommended. No abd pain to suggest emergent process. Advised on f/u and return precautions.       ? ?There are no diagnoses linked to this encounter. ? ? ?Time:   ?Today, I have spent 10 minutes with the patient with telehealth technology discussing the above problems, reviewing the chart, previous notes, medications and orders.  ? ? ?Tests Ordered: ?No orders of the defined types were placed in this encounter. ? ? ?Medication Changes: ?Meds ordered this encounter  ?Medications  ? dicyclomine (BENTYL) 10 MG  capsule  ?  Sig: Take 1 capsule (10 mg total) by mouth 4 (four) times daily -  before meals and at bedtime.  ?  Dispense:  20 capsule  ?  Refill:  0  ?  Order Specific Question:   Supervising Provider  ?  Answer:   Noemi Chapel [[id number]]  ? ? ? ?Disposition:  Follow up  ?Signed, ?[place], PA-C  ?12/12/2021 11:43 AM    ? ? ?

## 2022-05-13 ENCOUNTER — Telehealth: Payer: 59 | Admitting: Family Medicine

## 2022-05-13 DIAGNOSIS — K2900 Acute gastritis without bleeding: Secondary | ICD-10-CM | POA: Diagnosis not present

## 2022-05-13 DIAGNOSIS — R197 Diarrhea, unspecified: Secondary | ICD-10-CM

## 2022-05-13 NOTE — Patient Instructions (Signed)
Tyler Krause, thank you for joining Perlie Mayo, NP for today's virtual visit.  While this provider is not your primary care provider (PCP), if your PCP is located in our provider database this encounter information will be shared with them immediately following your visit.  Consent: (Patient) Tyler Krause provided verbal consent for this virtual visit at the beginning of the encounter.  Current Medications:  Current Outpatient Medications:    dicyclomine (BENTYL) 10 MG capsule, Take 1 capsule (10 mg total) by mouth 4 (four) times daily -  before meals and at bedtime., Disp: 20 capsule, Rfl: 0   omeprazole (PRILOSEC) 20 MG capsule, TAKE 1 CAPSULE(20 MG) BY MOUTH DAILY, Disp: 30 capsule, Rfl: 3   Medications ordered in this encounter:  No orders of the defined types were placed in this encounter.    *If you need refills on other medications prior to your next appointment, please contact your pharmacy*  Follow-Up: Call back or seek an in-person evaluation if the symptoms worsen or if the condition fails to improve as anticipated.  Other Instructions Diarrhea, Adult Diarrhea is when you pass loose and watery poop (stool) often. Diarrhea can make you feel weak and cause you to lose water in your body (get dehydrated). Losing water in your body can cause you to: Feel tired and thirsty. Have a dry mouth. Go pee (urinate) less often. Diarrhea often lasts 2-3 days. However, it can last longer if it is a sign of something more serious. It is important to treat your diarrhea as told by your doctor. Follow these instructions at home: Eating and drinking     Follow these instructions as told by your doctor: Take an ORS (oral rehydration solution). This is a drink that helps you replace fluids and minerals your body lost. It is sold at pharmacies and stores. Drink plenty of fluids, such as: Water. Ice chips. Diluted fruit juice. Low-calorie sports drinks. Milk, if you  want. Avoid drinking fluids that have a lot of sugar or caffeine in them. Eat bland, easy-to-digest foods in small amounts as you are able. These foods include: Bananas. Applesauce. Rice. Low-fat (lean) meats. Toast. Crackers. Avoid alcohol. Avoid spicy or fatty foods.  Medicines Take over-the-counter and prescription medicines only as told by your doctor. If you were prescribed an antibiotic medicine, take it as told by your doctor. Do not stop using the antibiotic even if you start to feel better. General instructions  Wash your hands often using soap and water. If soap and water are not available, use a hand sanitizer. Others in your home should wash their hands as well. Hands should be washed: After using the toilet or changing a diaper. Before preparing, cooking, or serving food. While caring for a sick person. While visiting someone in a hospital. Drink enough fluid to keep your pee (urine) pale yellow. Rest at home while you get better. Take a warm bath to help with any burning or pain from having diarrhea. Watch your condition for any changes. Keep all follow-up visits as told by your doctor. This is important. Contact a doctor if: You have a fever. Your diarrhea gets worse. You have new symptoms. You cannot keep fluids down. You feel light-headed or dizzy. You have a headache. You have muscle cramps. Get help right away if: You have chest pain. You feel very weak or you pass out (faint). You have bloody or black poop or poop that looks like tar. You have very bad pain, cramping,  or bloating in your belly (abdomen). You have trouble breathing or you are breathing very quickly. Your heart is beating very quickly. Your skin feels cold and clammy. You feel confused. You have signs of losing too much water in your body, such as: Dark pee, very little pee, or no pee. Cracked lips. Dry mouth. Sunken eyes. Sleepiness. Weakness. Summary Diarrhea is when you pass  loose and watery poop (stool) often. Diarrhea can make you feel weak and cause you to lose water in your body (get dehydrated). Take an ORS (oral rehydration solution). This is a drink that is sold at pharmacies and stores. Eat bland, easy-to-digest foods in small amounts as you are able. Contact a doctor if your condition gets worse. Get help right away if you have signs that you have lost too much water in your body. This information is not intended to replace advice given to you by your health care provider. Make sure you discuss any questions you have with your health care provider. Document Revised: 12/07/2021 Document Reviewed: 03/28/2021 Elsevier Patient Education  [place].    If you have been instructed to have an in-person evaluation today at a local Urgent Care facility, please use the link below. It will take you to a list of all of our available [facility], including address, phone number and hours of operation. Please do not delay care.  [facility]  If you or a family member do not have a primary care provider, use the link below to schedule a visit and establish care. When you choose a [facility] primary care physician or advanced practice provider, you gain a long-term partner in health. Find a Primary Care Provider  Learn more about [facility]'s in-office and virtual care options: [facility]

## 2022-05-13 NOTE — Progress Notes (Signed)
Virtual Visit Consent   Tyler Krause, you are scheduled for a virtual visit with a [facility] provider today. Just as with appointments in the office, your consent must be obtained to participate. Your consent will be active for this visit and any virtual visit you may have with one of our providers in the next 365 days. If you have a MyChart account, a copy of this consent can be sent to you electronically.  As this is a virtual visit, video technology does not allow for your provider to perform a traditional examination. This may limit your provider's ability to fully assess your condition. If your provider identifies any concerns that need to be evaluated in person or the need to arrange testing (such as labs, EKG, etc.), we will make arrangements to do so. Although advances in technology are sophisticated, we cannot ensure that it will always work on either your end or our end. If the connection with a video visit is poor, the visit may have to be switched to a telephone visit. With either a video or telephone visit, we are not always able to ensure that we have a secure connection.  By engaging in this virtual visit, you consent to the provision of healthcare and authorize for your insurance to be billed (if applicable) for the services provided during this visit. Depending on your insurance coverage, you may receive a charge related to this service.  I need to obtain your verbal consent now. Are you willing to proceed with your visit today? Tyler Krause has provided verbal consent on 05/13/2022 for a virtual visit (video or telephone). Perlie Mayo, NP  Date: 05/13/2022 12:59 PM  Virtual Visit via Video Note   I, Perlie Mayo, connected with  Tyler Krause  ([id number], Oct 16, 1985) on 05/13/22 at  1:00 PM EDT by a video-enabled telemedicine application and verified that I am speaking with the correct person using two identifiers.  Location: Patient: Virtual Visit Location Patient:  Home Provider: Virtual Visit Location Provider: Home Office   I discussed the limitations of evaluation and management by telemedicine and the availability of in person appointments. The patient expressed understanding and agreed to proceed.    History of Present Illness: Tyler Krause is a 36 y.o. who identifies as a male who was assigned male at birth, and is being seen today for a stomach bug. Reports onset was mid morning today nausea, vomiting, diarrhea, abd bloating and cramping. He has some improvement of symptoms since this morning.  Abd discomfort is improved. He denies fevers, sweats, and chills.  Denies bloody stools or melena. Denies recent abx use or foreign travel.  He recently ate some take out- [employer] that smelled off, so he is not sure if this contributed to his symptoms. Did eat poorly over the week due to a family reunion. Reports "gut issues" thinks he might have IBS.   Needs work note   Problems:  Patient Active Problem List   Diagnosis Date Noted   Acute diarrhea 06/23/2015   OBESITY, NOS 11/27/2006   Alcohol abuse 11/27/2006   TOBACCO DEPENDENCE 11/27/2006   ATTENTION DEFICIT, W/HYPERACTIVITY 11/27/2006   HYPERTENSION, BENIGN SYSTEMIC 11/27/2006    Allergies:  Allergies  Allergen Reactions   Penicillins Hives and Rash   Medications:  Current Outpatient Medications:    dicyclomine (BENTYL) 10 MG capsule, Take 1 capsule (10 mg total) by mouth 4 (four) times daily -  before meals and at bedtime., Disp: 20  capsule, Rfl: 0   omeprazole (PRILOSEC) 20 MG capsule, TAKE 1 CAPSULE(20 MG) BY MOUTH DAILY, Disp: 30 capsule, Rfl: 3  Observations/Objective: Patient is well-developed, well-nourished in no acute distress.  Resting comfortably  at home.  Head is normocephalic, atraumatic.  No labored breathing.  Speech is clear and coherent with logical content.  Patient is alert and oriented at baseline.   Assessment and Plan:  1. Acute gastritis without  hemorrhage, unspecified gastritis type   2. Diarrhea, unspecified type   -questionable food related, unDx IBS or the alike -advised follow up with PCP and referral for GI if needed -OTC symptom management reviewed  -hydrate -rest  Work note provided   Reviewed side effects, risks and benefits of medication.    Patient acknowledged agreement and understanding of the plan.   Past Medical, Surgical, Social History, Allergies, and Medications have been Reviewed.    Follow Up Instructions: I discussed the assessment and treatment plan with the patient. The patient was provided an opportunity to ask questions and all were answered. The patient agreed with the plan and demonstrated an understanding of the instructions.  A copy of instructions were sent to the patient via MyChart unless otherwise noted below.     The patient was advised to call back or seek an in-person evaluation if the symptoms worsen or if the condition fails to improve as anticipated.  Time:  I spent 10 minutes with the patient via telehealth technology discussing the above problems/concerns.    Perlie Mayo, NP

## 2022-10-03 ENCOUNTER — Encounter: Payer: Self-pay | Admitting: Family Medicine

## 2022-10-03 ENCOUNTER — Ambulatory Visit (INDEPENDENT_AMBULATORY_CARE_PROVIDER_SITE_OTHER): Payer: Self-pay | Admitting: *Deleted

## 2022-10-03 ENCOUNTER — Telehealth: Payer: Self-pay | Admitting: Family Medicine

## 2022-10-03 DIAGNOSIS — R6889 Other general symptoms and signs: Secondary | ICD-10-CM

## 2022-10-03 MED ORDER — OSELTAMIVIR PHOSPHATE 75 MG PO CAPS: 75.0000 mg | ORAL_CAPSULE | Freq: Two times a day (BID) | ORAL | 0 refills | Status: AC

## 2022-10-03 NOTE — Progress Notes (Signed)
Virtual Visit Consent   Tyler Krause, you are scheduled for a virtual visit with a [facility] provider today. Just as with appointments in the office, your consent must be obtained to participate. Your consent will be active for this visit and any virtual visit you may have with one of our providers in the next 365 days. If you have a MyChart account, a copy of this consent can be sent to you electronically.  As this is a virtual visit, video technology does not allow for your provider to perform a traditional examination. This may limit your provider's ability to fully assess your condition. If your provider identifies any concerns that need to be evaluated in person or the need to arrange testing (such as labs, EKG, etc.), we will make arrangements to do so. Although advances in technology are sophisticated, we cannot ensure that it will always work on either your end or our end. If the connection with a video visit is poor, the visit may have to be switched to a telephone visit. With either a video or telephone visit, we are not always able to ensure that we have a secure connection.  By engaging in this virtual visit, you consent to the provision of healthcare and authorize for your insurance to be billed (if applicable) for the services provided during this visit. Depending on your insurance coverage, you may receive a charge related to this service.  I need to obtain your verbal consent now. Are you willing to proceed with your visit today? Tyler Krause has provided verbal consent on 10/03/2022 for a virtual visit (video or telephone). Perlie Mayo, NP  Date: 10/03/2022 3:32 PM  Virtual Visit via Video Note   I, Perlie Mayo, connected with  Tyler Krause  ([id number], [birth year]) on 10/03/22 at  3:15 PM EST by a video-enabled telemedicine application and verified that I am speaking with the correct person using two identifiers.  Location: Patient: Virtual Visit Location Patient:  Home Provider: Virtual Visit Location Provider: Home Office   I discussed the limitations of evaluation and management by telemedicine and the availability of in person appointments. The patient expressed understanding and agreed to proceed.    History of Present Illness: Tyler Krause is a 37 y.o. who identifies as a male who was assigned male at birth, and is being seen today for cold symptoms.   Onset was yesterday morning- eye pressure and sensitivity. Developed some weakness. Right ear pain and clogged feeling, body aches, congestion, head sinus pressure, ear watering and burning- light sensitivities, last 24 hours have seen an increase in symptom severity. Took mucinex max- limited help. Has felt feverish and chills. History of bad sinuses.  Denies chest pain, shortness of breath.  Recent exposure to flu and covid at work.  Problems: There are no problems to display for this patient.   Allergies: Not on File Medications: No current outpatient medications on file.  Observations/Objective: Patient is well-developed, well-nourished in no acute distress.  Resting comfortably  at home.  Head is normocephalic, atraumatic.  No labored breathing.  Speech is clear and coherent with logical content.  Patient is alert and oriented at baseline.    Assessment and Plan:  1. Flu-like symptoms  - oseltamivir (TAMIFLU) 75 MG capsule; Take 1 capsule (75 mg total) by mouth 2 (two) times daily for 5 days.  Dispense: 10 capsule; Refill: 0   -note placed in true chart so he could get it in mychart.   -  Take meds as prescribed -Rest -Use a cool mist humidifier especially during the winter months when heat dries out the air. - Use saline nose sprays frequently to help soothe nasal passages and promote drainage. -Saline irrigations of the nose can be very helpful if done frequently.             * 4X daily for 1 week*             * Use of a nettie pot can be helpful with this.  *Follow  directions with this* *Boiled or distilled water only -stay hydrated by drinking plenty of fluids - Keep thermostat turn down low to prevent drying out sinuses - For any cough or congestion- robitussin DM or Delsym as needed - For fever or aches or pains- take tylenol or ibuprofen as directed on bottle             * for fevers greater than 101 orally you may alternate ibuprofen and tylenol every 3 hours.  If you do not improve you will need a follow up visit in person.              Reviewed side effects, risks and benefits of medication.   Patient acknowledged agreement and understanding of the plan.  Past Medical, Surgical, Social History, Allergies, and Medications have been Reviewed.     Follow Up Instructions: I discussed the assessment and treatment plan with the patient. The patient was provided an opportunity to ask questions and all were answered. The patient agreed with the plan and demonstrated an understanding of the instructions.  A copy of instructions were sent to the patient via MyChart unless otherwise noted below.    The patient was advised to call back or seek an in-person evaluation if the symptoms worsen or if the condition fails to improve as anticipated.  Time:  I spent 10 minutes with the patient via telehealth technology discussing the above problems/concerns.    Perlie Mayo, NP

## 2022-10-03 NOTE — Patient Instructions (Signed)
Tyler Krause, thank you for joining Perlie Mayo, NP for today's virtual visit.  While this provider is not your primary care provider (PCP), if your PCP is located in our provider database this encounter information will be shared with them immediately following your visit.   [employer] account gives you access to today's visit and all your visits, tests, and labs performed at [facility] " click here if you don't have a [facility] account or go to [email or web address]  Consent: (Patient) Tyler Krause provided verbal consent for this virtual visit at the beginning of the encounter.  Current Medications:  Current Outpatient Medications:    oseltamivir (TAMIFLU) 75 MG capsule, Take 1 capsule (75 mg total) by mouth 2 (two) times daily for 5 days., Disp: 10 capsule, Rfl: 0   Medications ordered in this encounter:  Meds ordered this encounter  Medications   oseltamivir (TAMIFLU) 75 MG capsule    Sig: Take 1 capsule (75 mg total) by mouth 2 (two) times daily for 5 days.    Dispense:  10 capsule    Refill:  0    Order Specific Question:   Supervising Provider    Answer:   Chase Picket [id number]     *If you need refills on other medications prior to your next appointment, please contact your pharmacy*  Follow-Up: Call back or seek an in-person evaluation if the symptoms worsen or if the condition fails to improve as anticipated.  [facility] [phone]  Other Instructions Influenza, Adult Influenza is also called "the flu." It is an infection in the lungs, nose, and throat (respiratory tract). It spreads easily from person to person (is contagious). The flu causes symptoms that are like a cold, along with high fever and body aches. What are the causes? This condition is caused by the influenza virus. You can get the virus by: Breathing in droplets that are in the air after a person infected with the flu coughed or  sneezed. Touching something that has the virus on it and then touching your mouth, nose, or eyes. What increases the risk? Certain things may make you more likely to get the flu. These include: Not washing your hands often. Having close contact with many people during cold and flu season. Touching your mouth, eyes, or nose without first washing your hands. Not getting a flu shot every year. You may have a higher risk for the flu, and serious problems, such as a lung infection (pneumonia), if you: Are older than 65. Are pregnant. Have a weakened disease-fighting system (immune system) because of a disease or because you are taking certain medicines. Have a long-term (chronic) condition, such as: Heart, kidney, or lung disease. Diabetes. Asthma. Have a liver disorder. Are very overweight (morbidly obese). Have anemia. What are the signs or symptoms? Symptoms usually begin suddenly and last 4-14 days. They may include: Fever and chills. Headaches, body aches, or muscle aches. Sore throat. Cough. Runny or stuffy (congested) nose. Feeling discomfort in your chest. Not wanting to eat as much as normal. Feeling weak or tired. Feeling dizzy. Feeling sick to your stomach or throwing up. How is this treated? If the flu is found early, you can be treated with antiviral medicine. This can help to reduce how bad the illness is and how long it lasts. This may be given by mouth or through an IV tube. Taking care of yourself at home can help your symptoms get better.  Your doctor may want you to: Take over-the-counter medicines. Drink plenty of fluids. The flu often goes away on its own. If you have very bad symptoms or other problems, you may be treated in a hospital. Follow these instructions at home:     Activity Rest as needed. Get plenty of sleep. Stay home from work or school as told by your doctor. Do not leave home until you do not have a fever for 24 hours without taking  medicine. Leave home only to go to your doctor. Eating and drinking Take an ORS (oral rehydration solution). This is a drink that is sold at pharmacies and stores. Drink enough fluid to keep your pee pale yellow. Drink clear fluids in small amounts as you are able. Clear fluids include: Water. Ice chips. Fruit juice mixed with water. Low-calorie sports drinks. Eat bland foods that are easy to digest. Eat small amounts as you are able. These foods include: Bananas. Applesauce. Rice. Lean meats. Toast. Crackers. Do not eat or drink: Fluids that have a lot of sugar or caffeine. Alcohol. Spicy or fatty foods. General instructions Take over-the-counter and prescription medicines only as told by your doctor. Use a cool mist humidifier to add moisture to the air in your home. This can make it easier for you to breathe. When using a cool mist humidifier, clean it daily. Empty water and replace with clean water. Cover your mouth and nose when you cough or sneeze. Wash your hands with soap and water often and for at least 20 seconds. This is also important after you cough or sneeze. If you cannot use soap and water, use alcohol-based hand sanitizer. Keep all follow-up visits. How is this prevented?  Get a flu shot every year. You may get the flu shot in late summer, fall, or winter. Ask your doctor when you should get your flu shot. Avoid contact with people who are sick during fall and winter. This is cold and flu season. Contact a doctor if: You get new symptoms. You have: Chest pain. Watery poop (diarrhea). A fever. Your cough gets worse. You start to have more mucus. You feel sick to your stomach. You throw up. Get help right away if you: Have shortness of breath. Have trouble breathing. Have skin or nails that turn a bluish color. Have very bad pain or stiffness in your neck. Get a sudden headache. Get sudden pain in your face or ear. Cannot eat or drink without throwing  up. These symptoms may represent a serious problem that is an emergency. Get medical help right away. Call your local emergency services (911 in the [place] Do not wait to see if the symptoms will go away. Do not drive yourself to the hospital. Summary Influenza is also called "the flu." It is an infection in the lungs, nose, and throat. It spreads easily from person to person. Take over-the-counter and prescription medicines only as told by your doctor. Getting a flu shot every year is the best way to not get the flu. This information is not intended to replace advice given to you by your health care provider. Make sure you discuss any questions you have with your health care provider. Document Revised: 05/05/2020 Document Reviewed: 05/05/2020 Elsevier Patient Education  [place].     If you have been instructed to have an in-person evaluation today at a local Urgent Care facility, please use the link below. It will take you to a list of all of our available [facility]  Urgent Cares, including address, phone number and hours of operation. Please do not delay care.  [facility]  If you or a family member do not have a primary care provider, use the link below to schedule a visit and establish care. When you choose a [facility] primary care physician or advanced practice provider, you gain a long-term partner in health. Find a Primary Care Provider  Learn more about [facility]'s in-office and virtual care options: [facility]

## 2022-10-03 NOTE — Telephone Encounter (Signed)
First attempt to return his call.   Unable to leave a message because "Voicemail box has not been set up yet".

## 2022-10-03 NOTE — Telephone Encounter (Signed)
2nd attempt, pt's wife called, unable to LVM d/t Mailbox not set up. LVMTCB on pt's #.

## 2022-10-03 NOTE — Telephone Encounter (Signed)
  Chief Complaint: Sinus Pressure Symptoms: Headache, pressure, sore throat, runny nose, earache, runny eyes, subjective fever Frequency: Yesterday Pertinent Negatives: Patient denies SOB Disposition: '[]'$ ED /'[]'$ Urgent Care (no appt availability in office) / '[]'$ Appointment(In office/virtual)/ '[x]'$  [facility] Virtual [facility]/ '[]'$ Home Care/ '[]'$ Refused Recommended Disposition /'[]'$ [facility]/ '[]'$  Follow-up with PCP Additional Notes: No availability at practice, [facility] secured for pt. Care advise provided, verbalizes understanding.  Reason for Disposition  Earache  Answer Assessment - Initial Assessment Questions 1. LOCATION: "Where does it hurt?"      Head pressure 2. ONSET: "When did the sinus pain start?"  (e.g., hours, days)      Yesterday 3. SEVERITY: "How bad is the pain?"   (Scale 1-10; mild, moderate or severe)   - MILD (1-3): doesn't interfere with normal activities    - MODERATE (4-7): interferes with normal activities (e.g., work or school) or awakens from sleep   - SEVERE (8-10): excruciating pain and patient unable to do any normal activities        Moderate 4. RECURRENT SYMPTOM: "Have you ever had sinus problems before?" If Yes, ask: "When was the last time?" and "What happened that time?"       5. NASAL CONGESTION: "Is the nose blocked?" If Yes, ask: "Can you open it or must you breathe through your mouth?"     Runny nose 6. NASAL DISCHARGE: "Do you have discharge from your nose?" If so ask, "What color?"      7. FEVER: "Do you have a fever?" If Yes, ask: "What is it, how was it measured, and when did it start?"      Subjective, hot then chills 8. OTHER SYMPTOMS: "Do you have any other symptoms?" (e.g., sore throat, cough, earache, difficulty breathing)     Sore throat, clogged ear, body aches,runny nose  Protocols used: Sinus Pain or Congestion-A-AH

## 2022-10-03 NOTE — Telephone Encounter (Signed)
3rd attempt to return wife's call (On DPR) however voice mailbox not set up so unable to leave a message. Per policy after 3 attempts attempting to return call without success I have forwarded this to [place].

## 2022-10-03 NOTE — Telephone Encounter (Signed)
Message from Erick Blinks sent at 10/03/2022  9:40 AM EST  Summary: Not feeling well, no appt available.   Pt's wife called to report that he had to leave work today because of symptoms. Has a sore throat, body aches/pain.  Best contact: [phone]  Seeking appt today, nothing available.          Call History   Type Contact Phone/Fax User  10/03/2022 09:37 AM EST Phone (Incoming) Janace Litten (Emergency Contact) [phone] Marlan Palau

## 2022-10-11 ENCOUNTER — Ambulatory Visit (INDEPENDENT_AMBULATORY_CARE_PROVIDER_SITE_OTHER): Payer: Self-pay

## 2022-10-11 ENCOUNTER — Telehealth: Payer: Self-pay | Admitting: Nurse Practitioner

## 2022-10-11 DIAGNOSIS — J0140 Acute pansinusitis, unspecified: Secondary | ICD-10-CM

## 2022-10-11 DIAGNOSIS — H6501 Acute serous otitis media, right ear: Secondary | ICD-10-CM

## 2022-10-11 MED ORDER — SULFAMETHOXAZOLE-TRIMETHOPRIM 800-160 MG PO TABS: 1.0000 | ORAL_TABLET | Freq: Two times a day (BID) | ORAL | 0 refills | Status: AC

## 2022-10-11 MED ORDER — CIPROFLOXACIN-DEXAMETHASONE 0.3-0.1 % OT SUSP: 4.0000 [drp] | Freq: Two times a day (BID) | OTIC | 0 refills | Status: AC

## 2022-10-11 MED ORDER — FLUTICASONE PROPIONATE 50 MCG/ACT NA SUSP: 2.0000 | Freq: Every day | NASAL | 6 refills | Status: DC

## 2022-10-11 MED ORDER — IBUPROFEN 600 MG PO TABS: 600.0000 mg | ORAL_TABLET | Freq: Three times a day (TID) | ORAL | 0 refills | Status: DC | PRN

## 2022-10-11 NOTE — Telephone Encounter (Signed)
Summary: ear pain, ear infection?   Patient states that he is having severe right ear pain. There are no available appointments. Pt is wanting to see if he can get medication called in.   Please advise      Called pt - LMOMTCB

## 2022-10-11 NOTE — Telephone Encounter (Signed)
Spoke to patient. States he has an apt with a virtual provider. Will see how that works out with getting prescribed medication.

## 2022-10-11 NOTE — Telephone Encounter (Signed)
Summary: ear pain, ear infection?   Patient states that he is having severe right ear pain. There are no available appointments. Pt is wanting to see if he can get medication called in.   Please advise         Called pt at home/mobile and work numbers. LMOMTCB

## 2022-10-11 NOTE — Progress Notes (Signed)
Virtual Visit Consent   [name], you are scheduled for a virtual visit with a [facility] provider today. Just as with appointments in the office, your consent must be obtained to participate. Your consent will be active for this visit and any virtual visit you may have with one of our providers in the next 365 days. If you have a MyChart account, a copy of this consent can be sent to you electronically.  As this is a virtual visit, video technology does not allow for your provider to perform a traditional examination. This may limit your provider's ability to fully assess your condition. If your provider identifies any concerns that need to be evaluated in person or the need to arrange testing (such as labs, EKG, etc.), we will make arrangements to do so. Although advances in technology are sophisticated, we cannot ensure that it will always work on either your end or our end. If the connection with a video visit is poor, the visit may have to be switched to a telephone visit. With either a video or telephone visit, we are not always able to ensure that we have a secure connection.  By engaging in this virtual visit, you consent to the provision of healthcare and authorize for your insurance to be billed (if applicable) for the services provided during this visit. Depending on your insurance coverage, you may receive a charge related to this service.  I need to obtain your verbal consent now. Are you willing to proceed with your visit today? [name] has provided verbal consent on 10/11/2022 for a virtual visit (video or telephone). [name], FNP  Date: 10/11/2022 4:00 PM  Virtual Visit via Video Note   I, [name], connected with  Tyler Krause  ([id number], 11/08/1985) on 10/11/22 at  4:00 PM EST by a video-enabled telemedicine application and verified that I am speaking with the correct person using two identifiers.  Location: Patient: Virtual Visit Location Patient:  Home Provider: Virtual Visit Location Provider: Home Office   I discussed the limitations of evaluation and management by telemedicine and the availability of in person appointments. The patient expressed understanding and agreed to proceed.   CC "I have a severe ear infection at this point, I've been doctoring it the last few days but nothing working and now the pain is unbearable regular pain medication isn't working nor pain reducing ear drops, I have to work threw the weekend into next week, I need to know the best solution "  History of Present Illness: Tyler Krause is a 37 y.o. who identifies as a male who was assigned male at birth, and is being seen today for ongoing ear pain and pressure that has been going on since he had abrupt URI/flu like symptoms earlier this month.   He has been using numbing ear drops, over the counter pain medications for relief   Today he feels his ear pain is a 10/10 in his right ear and radiated to jaw and head. Painful to lay on his right side. Tender to touch-   He has continued to have sinus congestion as well   Loud noises are aggravating his ear He was treated on 10/03/22 for flu like symptoms with Tamiflu.   He has had COVID in the past and feels since having it he gets sick easier and symptoms are more severe than colds/viruses in the past   Problems:  Patient Active Problem List   Diagnosis Date Noted  Acute diarrhea 06/23/2015   OBESITY, NOS 11/27/2006   Alcohol abuse 11/27/2006   TOBACCO DEPENDENCE 11/27/2006   ATTENTION DEFICIT, W/HYPERACTIVITY 11/27/2006   HYPERTENSION, BENIGN SYSTEMIC 11/27/2006    Allergies:  Allergies  Allergen Reactions   Penicillins Hives and Rash   Medications:  Current Outpatient Medications:    dicyclomine (BENTYL) 10 MG capsule, Take 1 capsule (10 mg total) by mouth 4 (four) times daily -  before meals and at bedtime., Disp: 20 capsule, Rfl: 0   omeprazole (PRILOSEC) 20 MG capsule, TAKE 1 CAPSULE(20  MG) BY MOUTH DAILY, Disp: 30 capsule, Rfl: 3  Observations/Objective: Patient is well-developed, well-nourished in no acute distress.  Resting comfortably  at home.  Head is normocephalic, atraumatic.  No labored breathing.  Speech is clear and coherent with logical content.  Patient is alert and oriented at baseline.    Assessment and Plan: 1. Non-recurrent acute serous otitis media of right ear Use Debrox OTC as discussed   - ciprofloxacin-dexamethasone (CIPRODEX) OTIC suspension; Place 4 drops into the right ear 2 (two) times daily for 7 days.  Dispense: 7.5 mL; Refill: 0 - ibuprofen (ADVIL) 600 MG tablet; Take 1 tablet (600 mg total) by mouth every 8 (eight) hours as needed for moderate pain. Take with food  Dispense: 30 tablet; Refill: 0  2. Acute non-recurrent pansinusitis  - sulfamethoxazole-trimethoprim (BACTRIM DS) 800-160 MG tablet; Take 1 tablet by mouth 2 (two) times daily for 10 days.  Dispense: 20 tablet; Refill: 0 - fluticasone (FLONASE) 50 MCG/ACT nasal spray; Place 2 sprays into both nostrils daily.  Dispense: 16 g; Refill: 6     Follow Up Instructions: I discussed the assessment and treatment plan with the patient. The patient was provided an opportunity to ask questions and all were answered. The patient agreed with the plan and demonstrated an understanding of the instructions.  A copy of instructions were sent to the patient via MyChart unless otherwise noted below.    The patient was advised to call back or seek an in-person evaluation if the symptoms worsen or if the condition fails to improve as anticipated.  Time:  I spent 7 minutes with the patient via telehealth technology discussing the above problems/concerns.     [name], FNP

## 2022-10-11 NOTE — Telephone Encounter (Signed)
     Chief Complaint: Right ear ache, asking to be worked in today. Symptoms: Above Frequency: Sick last week with URI Pertinent Negatives: Patient denies FEVER Disposition: '[]'$ ED /'[]'$ Urgent Care (no appt availability in office) / '[]'$ Appointment(In office/virtual)/ '[]'$  [facility]/ '[]'$ Home Care/ '[]'$ Refused Recommended Disposition /'[]'$ [facility]/ '[x]'$  Follow-up with PCP Additional Notes: Refuses [facility] Tele-visit. Please advise pt.   Answer Assessment - Initial Assessment Questions 1. LOCATION: "Which ear is involved?"     Right 2. ONSET: "When did the ear start hurting"      Last week 3. SEVERITY: "How bad is the pain?"  (Scale 1-10; mild, moderate or severe)   - MILD (1-3): doesn't interfere with normal activities    - MODERATE (4-7): interferes with normal activities or awakens from sleep    - SEVERE (8-10): excruciating pain, unable to do any normal activities      Moderate 4. URI SYMPTOMS: "Do you have a runny nose or cough?"     Last week 5. FEVER: "Do you have a fever?" If Yes, ask: "What is your temperature, how was it measured, and when did it start?"     No 6. CAUSE: "Have you been swimming recently?", "How often do you use Q-TIPS?", "Have you had any recent air travel or scuba diving?"     No 7. OTHER SYMPTOMS: "Do you have any other symptoms?" (e.g., headache, stiff neck, dizziness, vomiting, runny nose, decreased hearing)     URI 8. PREGNANCY: "Is there any chance you are pregnant?" "When was your last menstrual period?"     N/A  Protocols used: Bethann Punches

## 2024-02-26 ENCOUNTER — Ambulatory Visit: Admitting: Gastroenterology

## 2024-04-01 ENCOUNTER — Ambulatory Visit: Admitting: Nurse Practitioner

## 2024-06-10 ENCOUNTER — Ambulatory Visit (INDEPENDENT_AMBULATORY_CARE_PROVIDER_SITE_OTHER): Payer: Self-pay

## 2024-06-10 ENCOUNTER — Ambulatory Visit
Admission: RE | Admit: 2024-06-10 | Discharge: 2024-06-10 | Disposition: A | Discharge status: Home / Self Care | Payer: Self-pay | Source: Ambulatory Visit | Attending: Emergency Medicine | Admitting: Emergency Medicine

## 2024-06-10 ENCOUNTER — Other Ambulatory Visit: Payer: Self-pay

## 2024-06-10 VITALS — BP 144/84 | HR 67 | Temp 97.7°F | Resp 16

## 2024-06-10 DIAGNOSIS — D3610 Benign neoplasm of peripheral nerves and autonomic nervous system, unspecified: Secondary | ICD-10-CM

## 2024-06-10 DIAGNOSIS — M79671 Pain in right foot: Secondary | ICD-10-CM

## 2024-06-10 NOTE — ED Provider Notes (Signed)
 [facility]    CSN: [id number] Arrival date & time: 06/10/24  1302     History   Chief Complaint Chief Complaint  Patient presents with   Foot Pain    Entered by patient    HPI Tyler Krause is a 38 y.o. male.  Here with right foot pain for about a week Feels at the base of 2nd toe. Sharp pain, feels like he's walking on something. The discomfort has caused his gait to change, and at the end of the day he feels some soreness in the lower calf.  Denies known injury or trauma, however he is on his feet consistently for work. Reports walks about 20,000 steps daily Did get new shoes recently that might be too tight   Not having numbness/tingling in the toes   He is applying for medicaid currently.   Past Medical History:  Diagnosis Date   Allergy    Hypertension     Patient Active Problem List   Diagnosis Date Noted   Acute diarrhea 06/23/2015   OBESITY, NOS 11/27/2006   Alcohol abuse 11/27/2006   TOBACCO DEPENDENCE 11/27/2006   ATTENTION DEFICIT, W/HYPERACTIVITY 11/27/2006   HYPERTENSION, BENIGN SYSTEMIC 11/27/2006    Past Surgical History:  Procedure Laterality Date   testicular torsion Right        Home Medications    Prior to Admission medications   Not on File    Family History Family History  Problem Relation Age of Onset   Colon cancer Mother    Pancreatic cancer Neg Hx    Esophageal cancer Neg Hx    Liver cancer Neg Hx    Stomach cancer Neg Hx     Social History Social History   Tobacco Use   Smoking status: Former    Current packs/day: 0.00    Types: Cigarettes    Quit date: 03/27/2009    Years since quitting: 15.2   Smokeless tobacco: Never  Substance Use Topics   Alcohol use: Not Currently    Comment: 6 pack a week   Drug use: Yes    Types: Marijuana    Comment: once a day     Allergies   Penicillins   Review of Systems Review of Systems  As per HPI  Physical Exam Triage Vital Signs ED Triage Vitals   Encounter Vitals Group     BP 06/10/24 1311 (!) 144/84     Girls Systolic BP Percentile --      Girls Diastolic BP Percentile --      Boys Systolic BP Percentile --      Boys Diastolic BP Percentile --      Pulse Rate 06/10/24 1311 67     Resp 06/10/24 1311 16     Temp 06/10/24 1311 97.7 F (36.5 C)     Temp Source 06/10/24 1311 Oral     SpO2 06/10/24 1311 98 %     Weight --      Height --      Head Circumference --      Peak Flow --      Pain Score 06/10/24 1310 4     Pain Loc --      Pain Education --      Exclude from Growth Chart --    No data found.  Updated Vital Signs BP (!) 144/84   Pulse 67   Temp 97.7 F (36.5 C) (Oral)   Resp 16   SpO2 98%    Physical  Exam Vitals and nursing note reviewed.  Constitutional:      General: He is not in acute distress. HENT:     Mouth/Throat:     Pharynx: Oropharynx is clear.  Cardiovascular:     Rate and Rhythm: Normal rate and regular rhythm.     Pulses: Normal pulses.  Pulmonary:     Effort: Pulmonary effort is normal.  Feet:     Comments: No bony tenderness of ankle, foot, toes. ROM normal. DP and PT pulses 2+. Cap refill < 2 seconds. Distal sensation intact. No obvious deformity, swelling, bruising.  Skin:    General: Skin is warm and dry.     Capillary Refill: Capillary refill takes less than 2 seconds.  Neurological:     Mental Status: He is alert and oriented to person, place, and time.     Gait: Gait normal.     UC Treatments / Results  Labs (all labs ordered are listed, but only abnormal results are displayed) Labs Reviewed - No data to display  EKG   Radiology DG Foot Complete Right Result Date: 06/10/2024 CLINICAL DATA:  Toe pain EXAM: RIGHT FOOT COMPLETE - 3+ VIEW COMPARISON:  Radiographs 10/27/2010 FINDINGS: The mineralization and alignment are normal. There is no evidence of acute fracture or dislocation. The joint spaces appear preserved. No erosive changes are identified. Small posterior  calcaneal spur. Type 1 accessory navicular noted. The soft tissues appear unremarkable. IMPRESSION: No acute osseous findings or significant arthropathic changes. Small posterior calcaneal spur. Electronically Signed   By: Elsie Perone M.D.   On: 06/10/2024 13:47    Procedures Procedures  Medications Ordered in UC Medications - No data to display  Initial Impression / Assessment and Plan / UC Course  I have reviewed the triage vital signs and the nursing notes.  Pertinent labs & imaging results that were available during my care of the patient were reviewed by me and considered in my medical decision making (see chart for details).  I have offered xray of the toes to rule out any bony abnormality, although symptoms do seem consistent with a morton neuroma. Patient would like xray today. Imaging negative. Images independently reviewed by me, agree with radiology interpretation.  Recommend comfortable fitting shoes, orthotics, elevating and icing. Podiatry follow up. Information provided Note for job is given. Advised would need to speak with PCP if he requires FMLA or short term disability.  Agrees to plan, all questions answered   Final Clinical Impressions(s) / UC Diagnoses   Final diagnoses:  Right foot pain  Neuroma     Discharge Instructions      Xray of the toes does not show any bony abnormality.  As discussed, I suspect you have a neuroma (more information attached) Please call the foot specialist to make an appointment for follow up  I recommend elevating the foot, applying ice, and trying a shoe insert for comfort. Make sure you are wearing appropriately fitting shoes that are not too tight.     ED Prescriptions   None    PDMP not reviewed this encounter.   Abhiraj Dozal, PA-C 06/10/24 1357

## 2024-06-10 NOTE — Discharge Instructions (Addendum)
 Xray of the toes does not show any bony abnormality.  As discussed, I suspect you have a neuroma (more information attached) Please call the foot specialist to make an appointment for follow up  I recommend elevating the foot, applying ice, and trying a shoe insert for comfort. Make sure you are wearing appropriately fitting shoes that are not too tight.

## 2024-06-10 NOTE — ED Triage Notes (Signed)
 Pt c/o right posterior foot pain in padding of foot that radiates to 2nd toe and up [id number]. Pt denies injury.
